# Patient Record
Sex: Female | Born: 1950
Health system: Southern US, Community
[De-identification: ages and names within clinical notes are randomized; demographics above are authoritative.]

## PROBLEM LIST (undated history)

## (undated) DIAGNOSIS — C4492 Squamous cell carcinoma of skin, unspecified: Secondary | ICD-10-CM

## (undated) DIAGNOSIS — D229 Melanocytic nevi, unspecified: Secondary | ICD-10-CM

## (undated) DIAGNOSIS — I10 Essential (primary) hypertension: Secondary | ICD-10-CM

## (undated) HISTORY — DX: Squamous cell carcinoma of skin, unspecified: C44.92

## (undated) HISTORY — PX: ABDOMINAL HYSTERECTOMY: SHX81

## (undated) HISTORY — DX: Melanocytic nevi, unspecified: D22.9

## (undated) HISTORY — PX: BREAST BIOPSY: SHX20

---

## 2006-11-10 DIAGNOSIS — C4492 Squamous cell carcinoma of skin, unspecified: Secondary | ICD-10-CM

## 2006-11-10 HISTORY — DX: Squamous cell carcinoma of skin, unspecified: C44.92

## 2010-06-17 ENCOUNTER — Ambulatory Visit (HOSPITAL_COMMUNITY)
Admission: RE | Admit: 2010-06-17 | Discharge: 2010-06-17 | Payer: Self-pay | Source: Home / Self Care | Attending: Internal Medicine | Admitting: Internal Medicine

## 2013-06-20 ENCOUNTER — Other Ambulatory Visit (HOSPITAL_COMMUNITY): Payer: Self-pay | Admitting: Family Medicine

## 2013-06-20 DIAGNOSIS — Z139 Encounter for screening, unspecified: Secondary | ICD-10-CM

## 2013-06-21 ENCOUNTER — Ambulatory Visit (HOSPITAL_COMMUNITY)
Admission: RE | Admit: 2013-06-21 | Discharge: 2013-06-21 | Disposition: A | Discharge status: Home / Self Care | Payer: BC Managed Care – PPO | Source: Ambulatory Visit | Attending: Family Medicine | Admitting: Family Medicine

## 2013-06-21 DIAGNOSIS — Z139 Encounter for screening, unspecified: Secondary | ICD-10-CM

## 2013-06-21 DIAGNOSIS — Z1231 Encounter for screening mammogram for malignant neoplasm of breast: Secondary | ICD-10-CM | POA: Insufficient documentation

## 2013-10-02 ENCOUNTER — Telehealth: Payer: Self-pay | Admitting: *Deleted

## 2013-10-02 NOTE — Telephone Encounter (Signed)
Pt called wanting to schedule her colonoscopy. Please advise [phone]. Pt said her summer is filled up later this year is better for her.

## 2013-10-03 NOTE — Telephone Encounter (Signed)
Tried to call pt and line was busy.  

## 2013-10-25 NOTE — Telephone Encounter (Signed)
On my reminder list.

## 2014-01-22 DIAGNOSIS — D229 Melanocytic nevi, unspecified: Secondary | ICD-10-CM

## 2014-01-22 HISTORY — DX: Melanocytic nevi, unspecified: D22.9

## 2014-04-04 ENCOUNTER — Telehealth: Payer: Self-pay

## 2014-04-04 NOTE — Telephone Encounter (Signed)
Pt called to be set up for her tcs. She had received a letter that she's due for her 10 year tcs. [phone]

## 2014-04-29 ENCOUNTER — Other Ambulatory Visit: Payer: Self-pay

## 2014-04-29 ENCOUNTER — Telehealth: Payer: Self-pay

## 2014-04-29 DIAGNOSIS — Z1211 Encounter for screening for malignant neoplasm of colon: Secondary | ICD-10-CM

## 2014-04-29 NOTE — Telephone Encounter (Signed)
OK to schedule

## 2014-04-29 NOTE — Telephone Encounter (Signed)
Gastroenterology Pre-Procedure Review  Request Date: 04/29/2014 Requesting Physician: Dr. Orson Ape  PATIENT REVIEW QUESTIONS: The patient responded to the following health history questions as indicated:    Pt said she had a colonocopy about 11 years ago in [place] and was told to have her next in 10 years  1. Diabetes Melitis: no 2. Joint replacements in the past 12 months: no 3. Major health problems in the past 3 months: no 4. Has an artificial valve or MVP: no 5. Has a defibrillator: no 6. Has been advised in past to take antibiotics in advance of a procedure like teeth cleaning: no    MEDICATIONS & ALLERGIES:    Patient reports the following regarding taking any blood thinners:   Plavix? no Aspirin? no Coumadin? no  Patient confirms/reports the following medications:  Current Outpatient Prescriptions  Medication Sig Dispense Refill  . atenolol (TENORMIN) 25 MG tablet Take 25 mg by mouth 2 (two) times daily.    . simvastatin (ZOCOR) 10 MG tablet Take 10 mg by mouth daily.     No current facility-administered medications for this visit.    Patient confirms/reports the following allergies:  No Known Allergies  No orders of the defined types were placed in this encounter.    AUTHORIZATION INFORMATION Primary Insurance:  ID #:   Group #:  Pre-Cert / Auth required:  Pre-Cert / Auth #:   Secondary Insurance:   ID #:   Group #:  Pre-Cert / Auth required: Pre-Cert / Auth #:   SCHEDULE INFORMATION: Procedure has been scheduled as follows:  Date: 05/20/2014               Time: 9:30 Am Location: [facility] Short Stay  This Gastroenterology Pre-Precedure Review Form is being routed to the following provider(s): R. Garfield Cornea, MD

## 2014-04-29 NOTE — Telephone Encounter (Signed)
See separate triage.  

## 2014-04-30 MED ORDER — PEG-KCL-NACL-NASULF-NA ASC-C 100 G PO SOLR: 1.0000 | ORAL | Status: DC

## 2014-04-30 NOTE — Telephone Encounter (Signed)
Rx sent to the pharmacy and instructions mailed to pt.  

## 2014-05-08 ENCOUNTER — Telehealth: Payer: Self-pay

## 2014-05-09 NOTE — Telephone Encounter (Signed)
Pt had left message she has a question about her insurance. LMOM for a return call.

## 2014-05-10 ENCOUNTER — Telehealth: Payer: Self-pay

## 2014-05-10 NOTE — Telephone Encounter (Signed)
Patient was returning DS call. Patient is out of town and gave me this number for DS to call. [phone]

## 2014-05-10 NOTE — Telephone Encounter (Signed)
ERROR- the number with area code [phone] is incorrect number. Do not call that number.

## 2014-05-10 NOTE — Telephone Encounter (Signed)
LMOM that I had a message that she had a question about her insurance and that we close today at noon.

## 2014-05-20 ENCOUNTER — Ambulatory Visit (HOSPITAL_COMMUNITY)
Admission: RE | Admit: 2014-05-20 | Discharge: 2014-05-20 | Disposition: A | Discharge status: Home / Self Care | Payer: BC Managed Care – PPO | Source: Ambulatory Visit | Attending: Internal Medicine | Admitting: Internal Medicine

## 2014-05-20 ENCOUNTER — Encounter (HOSPITAL_COMMUNITY): Payer: Self-pay

## 2014-05-20 ENCOUNTER — Encounter (HOSPITAL_COMMUNITY)
Admission: RE | Disposition: A | Discharge status: Home / Self Care | Payer: Self-pay | Source: Ambulatory Visit | Attending: Internal Medicine

## 2014-05-20 DIAGNOSIS — K648 Other hemorrhoids: Secondary | ICD-10-CM | POA: Diagnosis not present

## 2014-05-20 DIAGNOSIS — Z1211 Encounter for screening for malignant neoplasm of colon: Secondary | ICD-10-CM

## 2014-05-20 DIAGNOSIS — K5730 Diverticulosis of large intestine without perforation or abscess without bleeding: Secondary | ICD-10-CM | POA: Insufficient documentation

## 2014-05-20 DIAGNOSIS — I10 Essential (primary) hypertension: Secondary | ICD-10-CM | POA: Diagnosis not present

## 2014-05-20 HISTORY — PX: COLONOSCOPY: SHX5424

## 2014-05-20 HISTORY — DX: Essential (primary) hypertension: I10

## 2014-05-20 SURGERY — COLONOSCOPY
Anesthesia: Moderate Sedation

## 2014-05-20 MED ORDER — ONDANSETRON HCL 4 MG/2ML IJ SOLN
INTRAMUSCULAR | Status: DC | PRN
  Administered 2014-05-20: 4 mg via INTRAVENOUS

## 2014-05-20 MED ORDER — SODIUM CHLORIDE 0.9 % IV SOLN
INTRAVENOUS | Status: DC
  Administered 2014-05-20: 09:00:00 via INTRAVENOUS

## 2014-05-20 MED ORDER — MEPERIDINE HCL 100 MG/ML IJ SOLN
INTRAMUSCULAR | Status: DC | PRN
  Administered 2014-05-20: 25 mg via INTRAVENOUS
  Administered 2014-05-20: 50 mg via INTRAVENOUS
  Administered 2014-05-20: 25 mg via INTRAVENOUS

## 2014-05-20 MED ORDER — MIDAZOLAM HCL 5 MG/5ML IJ SOLN
INTRAMUSCULAR | Status: DC | PRN
  Administered 2014-05-20: 1 mg via INTRAVENOUS
  Administered 2014-05-20 (×2): 2 mg via INTRAVENOUS
  Administered 2014-05-20: 1 mg via INTRAVENOUS

## 2014-05-20 MED ORDER — STERILE WATER FOR IRRIGATION IR SOLN
Status: DC | PRN
  Administered 2014-05-20: 09:00:00

## 2014-05-20 MED ORDER — ONDANSETRON HCL 4 MG/2ML IJ SOLN
INTRAMUSCULAR | Status: AC
  Filled 2014-05-20: qty 2

## 2014-05-20 MED ORDER — MEPERIDINE HCL 100 MG/ML IJ SOLN
INTRAMUSCULAR | Status: AC
  Filled 2014-05-20: qty 2

## 2014-05-20 MED ORDER — MIDAZOLAM HCL 5 MG/5ML IJ SOLN
INTRAMUSCULAR | Status: AC
  Filled 2014-05-20: qty 10

## 2014-05-20 NOTE — Discharge Instructions (Addendum)
Colonoscopy Discharge Instructions  Read the instructions outlined below and refer to this sheet in the next few weeks. These discharge instructions provide you with general information on caring for yourself after you leave the hospital. Your doctor may also give you specific instructions. While your treatment has been planned according to the most current medical practices available, unavoidable complications occasionally occur. If you have any problems or questions after discharge, call Dr. Gala Romney at [phone]. ACTIVITY  You may resume your regular activity, but move at a slower pace for the next 24 hours.   Take frequent rest periods for the next 24 hours.   Walking will help get rid of the air and reduce the bloated feeling in your belly (abdomen).   No driving for 24 hours (because of the medicine (anesthesia) used during the test).    Do not sign any important legal documents or operate any machinery for 24 hours (because of the anesthesia used during the test).  NUTRITION  Drink plenty of fluids.   You may resume your normal diet as instructed by your doctor.   Begin with a light meal and progress to your normal diet. Heavy or fried foods are harder to digest and may make you feel sick to your stomach (nauseated).   Avoid alcoholic beverages for 24 hours or as instructed.  MEDICATIONS  You may resume your normal medications unless your doctor tells you otherwise.  WHAT YOU CAN EXPECT TODAY  Some feelings of bloating in the abdomen.   Passage of more gas than usual.   Spotting of blood in your stool or on the toilet paper.  IF YOU HAD POLYPS REMOVED DURING THE COLONOSCOPY:  No aspirin products for 7 days or as instructed.   No alcohol for 7 days or as instructed.   Eat a soft diet for the next 24 hours.  FINDING OUT THE RESULTS OF YOUR TEST Not all test results are available during your visit. If your test results are not back during the visit, make an appointment  with your caregiver to find out the results. Do not assume everything is normal if you have not heard from your caregiver or the medical facility. It is important for you to follow up on all of your test results.  SEEK IMMEDIATE MEDICAL ATTENTION IF:  You have more than a spotting of blood in your stool.   Your belly is swollen (abdominal distention).   You are nauseated or vomiting.   You have a temperature over 101.   You have abdominal pain or discomfort that is severe or gets worse throughout the day.    Diverticulosis information provided  Repeat screening colonoscopy in 10 years  Diverticulosis Diverticulosis is the condition that develops when small pouches (diverticula) form in the wall of your colon. Your colon, or large intestine, is where water is absorbed and stool is formed. The pouches form when the inside layer of your colon pushes through weak spots in the outer layers of your colon. CAUSES  No one knows exactly what causes diverticulosis. RISK FACTORS  Being older than 51. Your risk for this condition increases with age. Diverticulosis is rare in people younger than 40 years. By age 35, almost everyone has it.  Eating a low-fiber diet.  Being frequently constipated.  Being overweight.  Not getting enough exercise.  Smoking.  Taking over-the-counter pain medicines, like aspirin and ibuprofen. SYMPTOMS  Most people with diverticulosis do not have symptoms. DIAGNOSIS  Because diverticulosis often has no symptoms,  health care providers often discover the condition during an exam for other colon problems. In many cases, a health care provider will diagnose diverticulosis while using a flexible scope to examine the colon (colonoscopy). TREATMENT  If you have never developed an infection related to diverticulosis, you may not need treatment. If you have had an infection before, treatment may include:  Eating more fruits, vegetables, and grains.  Taking a fiber  supplement.  Taking a live bacteria supplement (probiotic).  Taking medicine to relax your colon. HOME CARE INSTRUCTIONS   Drink at least 6-8 glasses of water each day to prevent constipation.  Try not to strain when you have a bowel movement.  Keep all follow-up appointments. If you have had an infection before:  Increase the fiber in your diet as directed by your health care provider or dietitian.  Take a dietary fiber supplement if your health care provider approves.  Only take medicines as directed by your health care provider. SEEK MEDICAL CARE IF:   You have abdominal pain.  You have bloating.  You have cramps.  You have not gone to the bathroom in 3 days. SEEK IMMEDIATE MEDICAL CARE IF:   Your pain gets worse.  Yourbloating becomes very bad.  You have a fever or chills, and your symptoms suddenly get worse.  You begin vomiting.  You have bowel movements that are bloody or black. MAKE SURE YOU:  Understand these instructions.  Will watch your condition.  Will get help right away if you are not doing well or get worse. Document Released: 02/19/2004 Document Revised: 05/29/2013 Document Reviewed: 04/18/2013 [facility] Patient Information 2015 [place], [place]. This information is not intended to replace advice given to you by your health care provider. Make sure you discuss any questions you have with your health care provider.

## 2014-05-20 NOTE — Op Note (Signed)
[facility] [address] [place], [address]   COLONOSCOPY PROCEDURE REPORT  PATIENT: Jacqueline, Weiss  MR#: [id number] BIRTHDATE: Apr 17, 1951 , 52  yrs. old GENDER: female ENDOSCOPIST: R.  Garfield Cornea, MD FACP [facility] REFERRED PR:FFMBWGY Orson Ape, M.D. PROCEDURE DATE:  26-May-2014 PROCEDURE:   Colonoscopy, screening INDICATIONS:Average risk colorectal cancer screening examination. MEDICATIONS: Versed 6 mg IV and Demerol 100 mg IV in divided doses. Zofran 4 mg IV. ASA CLASS:       Class II  CONSENT: The risks, benefits, alternatives and imponderables including but not limited to bleeding, perforation as well as the possibility of a missed lesion have been reviewed.  The potential for biopsy, lesion removal, etc. have also been discussed. Questions have been answered.  All parties agreeable.  Please see the history and physical in the medical record for more information.  DESCRIPTION OF PROCEDURE:   After the risks benefits and alternatives of the procedure were thoroughly explained, informed consent was obtained.  The digital rectal exam revealed no abnormalities of the rectum.   The EC-3890Li ([id number])  endoscope was introduced through the anus and advanced to the Minimal internal hemorrhoids; otherwise normal rectum.  Scattered left-sided diverticula; otherwise, normal-appearing colonic mucosa No adverse events experienced.   The quality of the prep was adequate.  The instrument was then slowly withdrawn as the colon was fully examined.  A retroflexion in the rectum was performed.     Withdrawal time=13 minutes 0 seconds.  The scope was withdrawn and the procedure completed. COMPLICATIONS: There were no immediate complications.  ENDOSCOPIC IMPRESSION: Minimal internal hemorrhoids. Left-sided diverticulosis  RECOMMENDATIONS: Repeat screening colonoscopy in 10 years  eSigned:  R. Garfield Cornea, MD Rosalita Chessman [facility] 26-May-2014 10:14 AM   cc:  CPT CODES: ICD  CODES:  The ICD and CPT codes recommended by this software are interpretations from the data that the clinical staff has captured with the software.  The verification of the translation of this report to the ICD and CPT codes and modifiers is the sole responsibility of the health care institution and practicing physician where this report was generated.  [employer]. will not be held responsible for the validity of the ICD and CPT codes included on this report.  AMA assumes no liability for data contained or not contained herein. CPT is a Designer, television/film set of the [employer].  PATIENT NAME:  Jacqueline, Weiss MR#: [id number]

## 2014-05-20 NOTE — H&P (Signed)
@  [facility]@   Primary Care Physician:  Leonides Grills, MD Primary Gastroenterologist:  Dr. Gala Romney  Pre-Procedure History & Physical: HPI:  Jacqueline Weiss is a 63 y.o. female is here for a screening colonoscopy. Last colonoscopy done by Dr.Pandya in [place] 11 years ago. Small benign left colon polyp resected per patient and diverticulosis. It was recommended she come back in 10 years. Currently, no GI symptoms. No family history of colon cancer.    Past Medical History  Diagnosis Date  . Hypertension     Past Surgical History  Procedure Laterality Date  . Abdominal hysterectomy      Prior to Admission medications   Medication Sig Start Date End Date Taking? Authorizing Provider  atenolol (TENORMIN) 25 MG tablet Take 25 mg by mouth 2 (two) times daily.   Yes Historical Provider, MD  peg 3350 powder (MOVIPREP) 100 G SOLR Take 1 kit (200 g total) by mouth as directed. 04/30/14  Yes Daneil Dolin, MD  simvastatin (ZOCOR) 10 MG tablet Take 10 mg by mouth daily.   Yes Historical Provider, MD    Allergies as of 04/29/2014  . (No Known Allergies)    History reviewed. No pertinent family history.  History   Social History  . Marital Status: Married    Spouse Name: N/A    Number of Children: N/A  . Years of Education: N/A   Occupational History  . Not on file.   Social History Main Topics  . Smoking status: Never Smoker   . Smokeless tobacco: Not on file  . Alcohol Use: 1.8 oz/week    3 Glasses of wine per week  . Drug Use: No  . Sexual Activity: Yes   Other Topics Concern  . Not on file   Social History Narrative  . No narrative on file    Review of Systems: See HPI, otherwise negative ROS  Physical Exam: BP 152/87 mmHg  Pulse 75  Temp(Src) 97.7 F (36.5 C) (Oral)  Resp 22  Ht _0  (1.549 m)  Wt 130 lb (58.968 kg)  BMI 24.58 kg/m2  SpO2 100% General:   Alert,  Well-developed, well-nourished, pleasant and cooperative in NAD Head:  Normocephalic and  atraumatic. Eyes:  Sclera clear, no icterus.   Conjunctiva pink. Ears:  Normal auditory acuity. Nose:  No deformity, discharge,  or lesions. Mouth:  No deformity or lesions, dentition normal. Neck:  Supple; no masses or thyromegaly. Lungs:  Clear throughout to auscultation.   No wheezes, crackles, or rhonchi. No acute distress. Heart:  Regular rate and rhythm; no murmurs, clicks, rubs,  or gallops. Abdomen:  Soft, nontender and nondistended. No masses, hepatosplenomegaly or hernias noted. Normal bowel sounds, without guarding, and without rebound.   Msk:  Symmetrical without gross deformities. Normal posture. Pulses:  Normal pulses noted. Extremities:  Without clubbing or edema. Neurologic:  Alert and  oriented x4;  grossly normal neurologically. Skin:  Intact without significant lesions or rashes. Cervical Nodes:  No significant cervical adenopathy. Psych:  Alert and cooperative. Normal mood and affect.  Impression/Plan: Jacqueline Weiss is now here to undergo a screening colonoscopy.  Average risk screening examination. Risks, benefits, limitations, imponderables and alternatives regarding colonoscopy have been reviewed with the patient. Questions have been answered. All parties agreeable.     Notice:  This dictation was prepared with Dragon dictation along with smaller phrase technology. Any transcriptional errors that result from this process are unintentional and may not be corrected upon review.

## 2014-05-22 ENCOUNTER — Encounter (HOSPITAL_COMMUNITY): Payer: Self-pay | Admitting: Internal Medicine

## 2014-05-28 NOTE — Telephone Encounter (Signed)
PT had procedure on 05/20/2014.

## 2014-05-29 NOTE — Telephone Encounter (Signed)
Pt had TCS 05/20/2014.

## 2015-02-13 ENCOUNTER — Ambulatory Visit: Payer: Self-pay | Admitting: Orthopedic Surgery

## 2015-02-18 ENCOUNTER — Ambulatory Visit (INDEPENDENT_AMBULATORY_CARE_PROVIDER_SITE_OTHER): Payer: BLUE CROSS/BLUE SHIELD

## 2015-02-18 ENCOUNTER — Ambulatory Visit (INDEPENDENT_AMBULATORY_CARE_PROVIDER_SITE_OTHER): Payer: BLUE CROSS/BLUE SHIELD | Admitting: Orthopedic Surgery

## 2015-02-18 ENCOUNTER — Encounter: Payer: Self-pay | Admitting: Orthopedic Surgery

## 2015-02-18 VITALS — BP 158/85 | Ht 61.0 in | Wt 133.0 lb

## 2015-02-18 DIAGNOSIS — M25562 Pain in left knee: Secondary | ICD-10-CM

## 2015-02-18 NOTE — Patient Instructions (Signed)
Joint Injection  Care After  Refer to this sheet in the next few days. These instructions provide you with information on caring for yourself after you have had a joint injection. Your caregiver also may give you more specific instructions. Your treatment has been planned according to current medical practices, but problems sometimes occur. Call your caregiver if you have any problems or questions after your procedure.  After any type of joint injection, it is not uncommon to experience:  · Soreness, swelling, or bruising around the injection site.  · Mild numbness, tingling, or weakness around the injection site caused by the numbing medicine used before or with the injection.  It also is possible to experience the following effects associated with the specific agent after injection:  · Iodine-based contrast agents:  ¨ Allergic reaction (itching, hives, widespread redness, and swelling beyond the injection site).  · Corticosteroids (These effects are rare.):  ¨ Allergic reaction.  ¨ Increased blood sugar levels (If you have diabetes and you notice that your blood sugar levels have increased, notify your caregiver).  ¨ Increased blood pressure levels.  ¨ Mood swings.  · Hyaluronic acid in the use of viscosupplementation.  ¨ Temporary heat or redness.  ¨ Temporary rash and itching.  ¨ Increased fluid accumulation in the injected joint.  These effects all should resolve within a day after your procedure.   HOME CARE INSTRUCTIONS  · Limit yourself to light activity the day of your procedure. Avoid lifting heavy objects, bending, stooping, or twisting.  · Take prescription or over-the-counter pain medication as directed by your caregiver.  · You may apply ice to your injection site to reduce pain and swelling the day of your procedure. Ice may be applied 03-04 times:  ¨ Put ice in a plastic bag.  ¨ Place a towel between your skin and the bag.  ¨ Leave the ice on for no longer than 15-20 minutes each time.  SEEK  IMMEDIATE MEDICAL CARE IF:   · Pain and swelling get worse rather than better or extend beyond the injection site.  · Numbness does not go away.  · Blood or fluid continues to leak from the injection site.  · You have chest pain.  · You have swelling of your face or tongue.  · You have trouble breathing or you become dizzy.  · You develop a fever, chills, or severe tenderness at the injection site that last longer than 1 day.  MAKE SURE YOU:  · Understand these instructions.  · Watch your condition.  · Get help right away if you are not doing well or if you get worse.  Document Released: 02/04/2011 Document Revised: 08/16/2011 Document Reviewed: 02/04/2011  ExitCare® Patient Information ©2015 [employer]. This information is not intended to replace advice given to you by your health care provider. Make sure you discuss any questions you have with your health care provider.

## 2015-02-18 NOTE — Progress Notes (Signed)
Patient ID: Jacqueline Weiss, female   DOB: 1950-10-09, 64 y.o.   MRN: [id number] New patient evaluation   Chief Complaint  Patient presents with  . Knee Pain    left knee pain and swelling x 4 months     Jacqueline Weiss is a 64 y.o. female.   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 No significant trauma other than this slight slip. She complains of pain going up the steps and with kneeling. She had intermittent time where she had some ankle pain and leg swelling that went away but the pain seemed to stay in the knee. It's anterior. It is associated with swelling and stiffness. It's worse after activity. Pain level is 3 out of 10.  Review of systems is normal  She does have a family history of hypertension Review of Systems See hpi  Past Medical History  Diagnosis Date  . Hypertension     Past Surgical History  Procedure Laterality Date  . Abdominal hysterectomy    . Colonoscopy N/A 05/20/2014    Procedure: COLONOSCOPY;  Surgeon: Daneil Dolin, MD;  Location: [facility];  Service: Endoscopy;  Laterality: N/A;  930    No family history on file.  Social History Social History  Substance Use Topics  . Smoking status: Never Smoker   . Smokeless tobacco: None  . Alcohol Use: 1.8 oz/week    3 Glasses of wine per week    No Known Allergies  Current Outpatient Prescriptions  Medication Sig Dispense Refill  . atenolol (TENORMIN) 25 MG tablet Take 25 mg by mouth 2 (two) times daily.    . hydrochlorothiazide (MICROZIDE) 12.5 MG capsule Take 12.5 mg by mouth daily.    . simvastatin (ZOCOR) 10 MG tablet Take 10 mg by mouth daily.     No current facility-administered medications for this visit.       Physical Exam Blood pressure 158/85, height 5\' 1"  (1.549 m), weight 133 lb (60.328 kg). Physical Exam The patient is well developed well nourished and well groomed. Orientation to person place and  time is normal  Mood is pleasant. Ambulatory status is normal without a limp Right knee range of motion stability and strength are normal neurovascular exam is intact and there is no swelling in the joint patellofemoral joint is nonreactive. Skin no surgical scars  Left knee there is swelling in the synovium around the patella there is crepitance there is a positive quadriceps contraction in addition test. This patellar facet pain with palpation. There is no frank joint effusion. She has decreased flexion on the left compared to the right negative McMurray sign no medial or lateral joint line tenderness ligaments are stable neurovascular exam remains intact motor exam is normal  Skin shows no surgical lesions  Data Reviewed Hospital x-rays. I interpreted those is normal left knee Assessment  Patellofemoral pain recommend injection and take Aleve twice a day or once a day for 2 weeks if no improvement please come back for review   Procedure note left knee injection verbal consent was obtained to inject left knee joint  Timeout was completed to confirm the site of injection  The medications used were 40 mg of Depo-Medrol and 1% lidocaine 3 cc  Anesthesia was provided by ethyl chloride and the skin was prepped with alcohol.  After cleaning the skin with alcohol a 20-gauge needle was used to inject the left knee joint.  There were no complications. A sterile bandage was applied.

## 2016-12-23 ENCOUNTER — Other Ambulatory Visit (HOSPITAL_COMMUNITY): Payer: Self-pay | Admitting: Internal Medicine

## 2016-12-23 DIAGNOSIS — Z1231 Encounter for screening mammogram for malignant neoplasm of breast: Secondary | ICD-10-CM

## 2017-04-13 ENCOUNTER — Ambulatory Visit (HOSPITAL_COMMUNITY): Payer: BLUE CROSS/BLUE SHIELD

## 2017-06-09 DIAGNOSIS — J019 Acute sinusitis, unspecified: Secondary | ICD-10-CM | POA: Diagnosis not present

## 2017-06-09 DIAGNOSIS — Z1389 Encounter for screening for other disorder: Secondary | ICD-10-CM | POA: Diagnosis not present

## 2017-06-09 DIAGNOSIS — H6993 Unspecified Eustachian tube disorder, bilateral: Secondary | ICD-10-CM | POA: Diagnosis not present

## 2017-06-09 DIAGNOSIS — Z6825 Body mass index (BMI) 25.0-25.9, adult: Secondary | ICD-10-CM | POA: Diagnosis not present

## 2017-07-11 ENCOUNTER — Encounter (HOSPITAL_COMMUNITY): Payer: Self-pay

## 2017-07-11 ENCOUNTER — Ambulatory Visit (HOSPITAL_COMMUNITY)
Admission: RE | Admit: 2017-07-11 | Discharge: 2017-07-11 | Disposition: A | Discharge status: Home / Self Care | Payer: Medicare Other | Source: Ambulatory Visit | Attending: Internal Medicine | Admitting: Internal Medicine

## 2017-07-11 DIAGNOSIS — Z1231 Encounter for screening mammogram for malignant neoplasm of breast: Secondary | ICD-10-CM | POA: Diagnosis not present

## 2017-07-11 DIAGNOSIS — R928 Other abnormal and inconclusive findings on diagnostic imaging of breast: Secondary | ICD-10-CM | POA: Diagnosis not present

## 2017-07-12 DIAGNOSIS — L820 Inflamed seborrheic keratosis: Secondary | ICD-10-CM | POA: Diagnosis not present

## 2017-07-12 DIAGNOSIS — L304 Erythema intertrigo: Secondary | ICD-10-CM | POA: Diagnosis not present

## 2017-07-12 DIAGNOSIS — D229 Melanocytic nevi, unspecified: Secondary | ICD-10-CM | POA: Diagnosis not present

## 2017-07-19 ENCOUNTER — Other Ambulatory Visit (HOSPITAL_COMMUNITY): Payer: Self-pay | Admitting: Internal Medicine

## 2017-07-19 DIAGNOSIS — R928 Other abnormal and inconclusive findings on diagnostic imaging of breast: Secondary | ICD-10-CM

## 2017-07-26 ENCOUNTER — Ambulatory Visit (HOSPITAL_COMMUNITY)
Admission: RE | Admit: 2017-07-26 | Discharge: 2017-07-26 | Disposition: A | Discharge status: Home / Self Care | Payer: Medicare Other | Source: Ambulatory Visit | Attending: Internal Medicine | Admitting: Internal Medicine

## 2017-07-26 DIAGNOSIS — R928 Other abnormal and inconclusive findings on diagnostic imaging of breast: Secondary | ICD-10-CM

## 2017-07-26 DIAGNOSIS — N6002 Solitary cyst of left breast: Secondary | ICD-10-CM | POA: Insufficient documentation

## 2017-07-26 DIAGNOSIS — R922 Inconclusive mammogram: Secondary | ICD-10-CM | POA: Diagnosis not present

## 2017-07-26 DIAGNOSIS — N6012 Diffuse cystic mastopathy of left breast: Secondary | ICD-10-CM | POA: Diagnosis not present

## 2017-08-03 DIAGNOSIS — E782 Mixed hyperlipidemia: Secondary | ICD-10-CM | POA: Diagnosis not present

## 2017-08-03 DIAGNOSIS — R7309 Other abnormal glucose: Secondary | ICD-10-CM | POA: Diagnosis not present

## 2017-08-03 DIAGNOSIS — R739 Hyperglycemia, unspecified: Secondary | ICD-10-CM | POA: Diagnosis not present

## 2017-08-03 DIAGNOSIS — Z1389 Encounter for screening for other disorder: Secondary | ICD-10-CM | POA: Diagnosis not present

## 2017-08-03 DIAGNOSIS — Z0000 Encounter for general adult medical examination without abnormal findings: Secondary | ICD-10-CM | POA: Diagnosis not present

## 2017-08-03 DIAGNOSIS — E663 Overweight: Secondary | ICD-10-CM | POA: Diagnosis not present

## 2017-08-03 DIAGNOSIS — Z6825 Body mass index (BMI) 25.0-25.9, adult: Secondary | ICD-10-CM | POA: Diagnosis not present

## 2017-08-03 DIAGNOSIS — N62 Hypertrophy of breast: Secondary | ICD-10-CM | POA: Diagnosis not present

## 2017-08-03 DIAGNOSIS — I10 Essential (primary) hypertension: Secondary | ICD-10-CM | POA: Diagnosis not present

## 2018-02-03 DIAGNOSIS — Z1389 Encounter for screening for other disorder: Secondary | ICD-10-CM | POA: Diagnosis not present

## 2018-02-03 DIAGNOSIS — R7309 Other abnormal glucose: Secondary | ICD-10-CM | POA: Diagnosis not present

## 2018-02-03 DIAGNOSIS — E7849 Other hyperlipidemia: Secondary | ICD-10-CM | POA: Diagnosis not present

## 2018-02-03 DIAGNOSIS — Z6824 Body mass index (BMI) 24.0-24.9, adult: Secondary | ICD-10-CM | POA: Diagnosis not present

## 2018-03-15 DIAGNOSIS — N898 Other specified noninflammatory disorders of vagina: Secondary | ICD-10-CM | POA: Diagnosis not present

## 2018-03-15 DIAGNOSIS — Z9289 Personal history of other medical treatment: Secondary | ICD-10-CM | POA: Diagnosis not present

## 2018-03-15 DIAGNOSIS — Z1212 Encounter for screening for malignant neoplasm of rectum: Secondary | ICD-10-CM | POA: Diagnosis not present

## 2018-03-15 DIAGNOSIS — Z1289 Encounter for screening for malignant neoplasm of other sites: Secondary | ICD-10-CM | POA: Diagnosis not present

## 2018-05-16 DIAGNOSIS — H25013 Cortical age-related cataract, bilateral: Secondary | ICD-10-CM | POA: Diagnosis not present

## 2018-05-16 DIAGNOSIS — H5203 Hypermetropia, bilateral: Secondary | ICD-10-CM | POA: Diagnosis not present

## 2018-05-16 DIAGNOSIS — H524 Presbyopia: Secondary | ICD-10-CM | POA: Diagnosis not present

## 2018-05-16 DIAGNOSIS — H2513 Age-related nuclear cataract, bilateral: Secondary | ICD-10-CM | POA: Diagnosis not present

## 2018-05-16 DIAGNOSIS — H52203 Unspecified astigmatism, bilateral: Secondary | ICD-10-CM | POA: Diagnosis not present

## 2018-08-08 DIAGNOSIS — D229 Melanocytic nevi, unspecified: Secondary | ICD-10-CM | POA: Diagnosis not present

## 2018-08-08 DIAGNOSIS — L820 Inflamed seborrheic keratosis: Secondary | ICD-10-CM | POA: Diagnosis not present

## 2018-08-17 DIAGNOSIS — E663 Overweight: Secondary | ICD-10-CM | POA: Diagnosis not present

## 2018-08-17 DIAGNOSIS — Z0001 Encounter for general adult medical examination with abnormal findings: Secondary | ICD-10-CM | POA: Diagnosis not present

## 2018-08-17 DIAGNOSIS — Z1389 Encounter for screening for other disorder: Secondary | ICD-10-CM | POA: Diagnosis not present

## 2018-08-17 DIAGNOSIS — Z6825 Body mass index (BMI) 25.0-25.9, adult: Secondary | ICD-10-CM | POA: Diagnosis not present

## 2018-08-31 ENCOUNTER — Other Ambulatory Visit: Payer: Self-pay | Admitting: Family Medicine

## 2018-09-01 ENCOUNTER — Other Ambulatory Visit: Payer: Self-pay | Admitting: Family Medicine

## 2018-09-01 DIAGNOSIS — Z1231 Encounter for screening mammogram for malignant neoplasm of breast: Secondary | ICD-10-CM

## 2018-09-01 DIAGNOSIS — R5381 Other malaise: Secondary | ICD-10-CM

## 2018-09-05 ENCOUNTER — Encounter: Payer: Self-pay | Admitting: *Deleted

## 2018-09-15 ENCOUNTER — Other Ambulatory Visit: Payer: Self-pay | Admitting: Family Medicine

## 2018-09-15 DIAGNOSIS — E2839 Other primary ovarian failure: Secondary | ICD-10-CM

## 2019-08-28 ENCOUNTER — Ambulatory Visit (INDEPENDENT_AMBULATORY_CARE_PROVIDER_SITE_OTHER): Payer: Medicare Other | Admitting: Physician Assistant

## 2019-08-28 ENCOUNTER — Other Ambulatory Visit: Payer: Self-pay

## 2019-08-28 ENCOUNTER — Encounter: Payer: Self-pay | Admitting: Physician Assistant

## 2019-08-28 DIAGNOSIS — Z1283 Encounter for screening for malignant neoplasm of skin: Secondary | ICD-10-CM | POA: Diagnosis not present

## 2019-08-28 DIAGNOSIS — S80812A Abrasion, left lower leg, initial encounter: Secondary | ICD-10-CM | POA: Diagnosis not present

## 2019-08-28 DIAGNOSIS — L820 Inflamed seborrheic keratosis: Secondary | ICD-10-CM | POA: Diagnosis not present

## 2019-08-28 DIAGNOSIS — T148XXA Other injury of unspecified body region, initial encounter: Secondary | ICD-10-CM

## 2019-08-28 NOTE — Progress Notes (Addendum)
   Follow-Up Visit   Subjective  Jacqueline Weiss is a 69 y.o. female who presents for the following: Annual Exam (Left lower leg x 68months-no itch no bleed, right mid back- scaly).   The following portions of the chart were reviewed this encounter and updated as appropriate: Allergies  Meds  Problems  Med Hx  Surg Hx  Fam Hx  Soc Hx     Objective  Well appearing patient in no apparent distress; mood and affect are within normal limits.  A full examination was performed including scalp, head, eyes, ears, nose, lips, neck, chest, axillae, abdomen, back, buttocks, bilateral upper extremities, bilateral lower extremities, hands, feet, fingers, toes, fingernails, and toenails. All findings within normal limits unless otherwise noted below.  Objective  Left Lower Leg - Anterior: Healed wound with PIH and small white scar  Objective  Head - to Toe: No atypical nevi  Objective  Right Lower Back: Stuck-on, waxy papules and plaques.   Assessment & Plan  Abrasion Left Lower Leg - Anterior  Screening exam for skin cancer Head - to Toe  Yearly skin exams  Seborrheic keratosis, inflamed Right Lower Back  Destruction of lesion - Right Lower Back Complexity: simple   Destruction method: cryotherapy   Informed consent: discussed and consent obtained   Timeout:  patient name, date of birth, surgical site, and procedure verified Lesion destroyed using liquid nitrogen: Yes   Cryotherapy cycles:  1 Outcome: patient tolerated procedure well with no complications   Post-procedure details: wound care instructions given   No atypical nevi noted at the time of the visit.

## 2019-08-30 DIAGNOSIS — Z0001 Encounter for general adult medical examination with abnormal findings: Secondary | ICD-10-CM | POA: Diagnosis not present

## 2019-08-30 DIAGNOSIS — I10 Essential (primary) hypertension: Secondary | ICD-10-CM | POA: Diagnosis not present

## 2019-08-30 DIAGNOSIS — Z6825 Body mass index (BMI) 25.0-25.9, adult: Secondary | ICD-10-CM | POA: Diagnosis not present

## 2019-08-30 DIAGNOSIS — R7309 Other abnormal glucose: Secondary | ICD-10-CM | POA: Diagnosis not present

## 2019-08-30 DIAGNOSIS — Z1389 Encounter for screening for other disorder: Secondary | ICD-10-CM | POA: Diagnosis not present

## 2019-08-30 DIAGNOSIS — E7849 Other hyperlipidemia: Secondary | ICD-10-CM | POA: Diagnosis not present

## 2019-10-23 ENCOUNTER — Encounter: Payer: Self-pay | Admitting: Physician Assistant

## 2019-12-25 ENCOUNTER — Encounter: Payer: Self-pay | Admitting: Nurse Practitioner

## 2019-12-25 ENCOUNTER — Other Ambulatory Visit: Payer: Self-pay

## 2019-12-25 ENCOUNTER — Ambulatory Visit (INDEPENDENT_AMBULATORY_CARE_PROVIDER_SITE_OTHER): Payer: Medicare Other | Admitting: Nurse Practitioner

## 2019-12-25 DIAGNOSIS — R195 Other fecal abnormalities: Secondary | ICD-10-CM

## 2019-12-25 NOTE — Progress Notes (Signed)
Referring Provider: Jake Samples, PA* Primary Care Physician:  Jake Samples, PA-C Primary GI:  Dr. Gala Romney  Chief Complaint  Patient presents with  . loose stools    ongoing but has gotten little worse; can relate to some foods; started probiotic-has helped some    HPI:   Jacqueline Weiss is a 69 y.o. female who presents for loose stools.  The patient has not been seen in our office before.  Last colonoscopy 05/20/2014 which found minimal internal hemorrhoids, left-sided diverticulosis.  Recommended repeat screening in 10 years (2025).  Today she states she's never had a "normal stool" for as long as she can remember in her adult life. She recently started having more loose stools. Her baseline is variable stools, sometimes watery, sometimes mixed. She was doing a lot of camping the last 3 years and her husband noticed she was having more loose stools (although they were eating lots of different foods). She will tend to have more loose stools if she whats greasy stools; diet dependent. She recently started using probiotics and she feels it helps. She showed me pictures of her stools and appears to be [place] 5-6. Ty[pically has 1 stool a day in the morning; empties completely. Denies abdominal pain, hematochezia, melena. Denies N/V, fever, chills, unintentional weight loss. Denies URI or flu-like symptoms. Denies loss of sense of taste or smell. The patient has not received COVID-19 vaccination(s). Declines information on scheduling a vaccine at this time. Denies chest pain, dyspnea, dizziness, lightheadedness, syncope, near syncope. Denies any other upper or lower GI symptoms.  If she does have greasy foods, Imodium helps.  Past Medical History:  Diagnosis Date  . Atypical nevus 01/22/2014   mild atypia - lower back (no treatment)  . Hypertension   . SCC (squamous cell carcinoma) in situ 11/10/2006   left lower leg - treated with Aldara cream    Past Surgical History:    Procedure Laterality Date  . ABDOMINAL HYSTERECTOMY    . BREAST BIOPSY Right    benign  . COLONOSCOPY N/A 05/20/2014   Procedure: COLONOSCOPY;  Surgeon: Daneil Dolin, MD;  Location: [facility];  Service: Endoscopy;  Laterality: N/A;  930    Current Outpatient Medications  Medication Sig Dispense Refill  . aspirin EC 81 MG tablet Take 81 mg by mouth daily.    Marland Kitchen atenolol (TENORMIN) 25 MG tablet Take 25 mg by mouth 2 (two) times daily.    . hydrochlorothiazide (MICROZIDE) 12.5 MG capsule Take 12.5 mg by mouth daily.    . Probiotic Product (PROBIOTIC DAILY PO) Take by mouth daily. 10 Strain Probiotic once a day    . simvastatin (ZOCOR) 10 MG tablet Take 10 mg by mouth daily.     No current facility-administered medications for this visit.    Allergies as of 12/25/2019  . (No Known Allergies)    Family History  Problem Relation Age of Onset  . Colon cancer Neg Hx     Social History   Socioeconomic History  . Marital status: Married    Spouse name: Not on file  . Number of children: Not on file  . Years of education: Not on file  . Highest education level: Not on file  Occupational History  . Not on file  Tobacco Use  . Smoking status: Never Smoker  . Smokeless tobacco: Never Used  Substance and Sexual Activity  . Alcohol use: Yes    Comment: couple glasses of wine daily  .  Drug use: No  . Sexual activity: Yes  Other Topics Concern  . Not on file  Social History Narrative  . Not on file   Social Determinants of Health   Financial Resource Strain:   . Difficulty of Paying Living Expenses:   Food Insecurity:   . Worried About Charity fundraiser in the Last Year:   . Arboriculturist in the Last Year:   Transportation Needs:   . Film/video editor (Medical):   Marland Kitchen Lack of Transportation (Non-Medical):   Physical Activity:   . Days of Exercise per Week:   . Minutes of Exercise per Session:   Stress:   . Feeling of Stress :   Social Connections:   .  Frequency of Communication with Friends and Family:   . Frequency of Social Gatherings with Friends and Family:   . Attends Religious Services:   . Active Member of Clubs or Organizations:   . Attends Archivist Meetings:   Marland Kitchen Marital Status:     Subjective: Review of Systems  Constitutional: Negative for chills, fever, malaise/fatigue and weight loss.  HENT: Negative for congestion and sore throat.   Respiratory: Negative for cough and shortness of breath.   Cardiovascular: Negative for chest pain and palpitations.  Gastrointestinal: Negative for abdominal pain, blood in stool, constipation, diarrhea, melena, nausea and vomiting.  Musculoskeletal: Negative for joint pain and myalgias.  Skin: Negative for rash.  Neurological: Negative for dizziness and weakness.  Endo/Heme/Allergies: Does not bruise/bleed easily.  Psychiatric/Behavioral: Negative for depression. The patient is not nervous/anxious.   All other systems reviewed and are negative.    Objective: BP (!) 158/73   Pulse 68   Temp (!) 97 F (36.1 C) (Oral)   Ht 5\' 1"  (1.549 m)   Wt 138 lb 3.2 oz (62.7 kg)   BMI 26.11 kg/m  Physical Exam Vitals and nursing note reviewed.  Constitutional:      General: She is not in acute distress.    Appearance: Normal appearance. She is well-developed and normal weight. She is not ill-appearing, toxic-appearing or diaphoretic.  HENT:     Head: Normocephalic and atraumatic.     Nose: No congestion or rhinorrhea.  Eyes:     General: No scleral icterus. Cardiovascular:     Rate and Rhythm: Normal rate and regular rhythm.     Heart sounds: Normal heart sounds.  Pulmonary:     Effort: Pulmonary effort is normal. No respiratory distress.     Breath sounds: Normal breath sounds.  Abdominal:     General: Bowel sounds are normal.     Palpations: Abdomen is soft. There is no hepatomegaly, splenomegaly or mass.     Tenderness: There is no abdominal tenderness. There is no  guarding or rebound.     Hernia: No hernia is present.  Skin:    General: Skin is warm and dry.     Coloration: Skin is not jaundiced.     Findings: No rash.  Neurological:     General: No focal deficit present.     Mental Status: She is alert and oriented to person, place, and time.  Psychiatric:        Attention and Perception: Attention normal.        Mood and Affect: Mood normal.        Speech: Speech normal.        Behavior: Behavior normal.        Thought Content: Thought content normal.  Cognition and Memory: Cognition and memory normal.       12/25/2019 9:16 AM   Disclaimer: This note was dictated with voice recognition software. Similar sounding words can inadvertently be transcribed and may not be corrected upon review.

## 2019-12-25 NOTE — Patient Instructions (Addendum)
Your health issues we discussed today were:   Soft/loose stool: 1. As we discussed, stools are variable amongst individuals 2. Your stools do seem a bit soft, but this can be your normal.  I am glad you have gotten relief from probiotics. 3. Continue taking the probiotics for at least 1 to 2 months to see if you have continued improvement 4. If you have a "dietary indiscretion" that results in loose stools you can use Imodium to help 5. Notify us if you see any drastic changes or any blood in your stools 6. Call us for any worsening or severe symptoms  Overall I recommend:  1. Continue your other current medications 2. Follow-up in 6 months 3. Call us if you have any questions or concerns   At [facility] we value your feedback. You may receive a survey about your visit today. Please share your experience as we strive to create trusting relationships with our patients to provide genuine, compassionate, quality care.  We appreciate your understanding and patience as we review any laboratory studies, imaging, and other diagnostic tests that are ordered as we care for you. Our office policy is 5 business days for review of these results, and any emergent or urgent results are addressed in a timely manner for your best interest. If you do not hear from our office in 1 week, please contact us.   We also encourage the use of MyChart, which contains your medical information for your review as well. If you are not enrolled in this feature, an access code is on this after visit summary for your convenience. Thank you for allowing [place] to be involved in your care.  It was great to see you today!  I hope you have a great Summer!!

## 2019-12-25 NOTE — Assessment & Plan Note (Signed)
Patient states her stools have been "somewhat abnormal" for as long as she can remember.  Typically has softer/looser stools than what most people have.  She has noticed this progressed a little bit recently with occasional loose/watery stools, particularly with dietary indiscretions such as greasy foods.  Her husband wanted her to get evaluated.  She showed me pictures of her stools and it appeared to be [place] to [place] 6.  She denies any other associated symptoms such as abdominal pain, nausea/vomiting, blood in her stools.  She is up-to-date on colonoscopy.  Her OB/GYN checks her stool for blood every year and has not found any.  At this point I feel she likely does have softer stools than her husband.  Recommend she continue probiotics as she has gotten some improvement with this.  She can use Imodium for loose stools related to dietary indiscretions.  Call for any worsening symptoms or concerns.  Follow-up in 6 months and then likely as needed after that, if no worsening.

## 2020-06-26 ENCOUNTER — Ambulatory Visit: Payer: Medicare Other | Admitting: Nurse Practitioner

## 2020-09-12 DIAGNOSIS — Z0001 Encounter for general adult medical examination with abnormal findings: Secondary | ICD-10-CM | POA: Diagnosis not present

## 2020-09-12 DIAGNOSIS — Z1331 Encounter for screening for depression: Secondary | ICD-10-CM | POA: Diagnosis not present

## 2020-09-12 DIAGNOSIS — Z6825 Body mass index (BMI) 25.0-25.9, adult: Secondary | ICD-10-CM | POA: Diagnosis not present

## 2020-09-12 DIAGNOSIS — R0989 Other specified symptoms and signs involving the circulatory and respiratory systems: Secondary | ICD-10-CM | POA: Diagnosis not present

## 2020-09-12 DIAGNOSIS — E663 Overweight: Secondary | ICD-10-CM | POA: Diagnosis not present

## 2020-09-12 DIAGNOSIS — Z1389 Encounter for screening for other disorder: Secondary | ICD-10-CM | POA: Diagnosis not present

## 2020-09-12 DIAGNOSIS — I10 Essential (primary) hypertension: Secondary | ICD-10-CM | POA: Diagnosis not present

## 2020-09-12 DIAGNOSIS — E7849 Other hyperlipidemia: Secondary | ICD-10-CM | POA: Diagnosis not present

## 2020-09-17 ENCOUNTER — Other Ambulatory Visit: Payer: Self-pay | Admitting: Family Medicine

## 2020-09-17 ENCOUNTER — Other Ambulatory Visit (HOSPITAL_COMMUNITY): Payer: Self-pay | Admitting: Family Medicine

## 2020-09-17 DIAGNOSIS — R0989 Other specified symptoms and signs involving the circulatory and respiratory systems: Secondary | ICD-10-CM

## 2020-09-17 DIAGNOSIS — E2839 Other primary ovarian failure: Secondary | ICD-10-CM

## 2020-10-02 ENCOUNTER — Ambulatory Visit (HOSPITAL_COMMUNITY)
Admission: RE | Admit: 2020-10-02 | Discharge: 2020-10-02 | Disposition: A | Discharge status: Home / Self Care | Payer: Medicare Other | Source: Ambulatory Visit | Attending: Family Medicine | Admitting: Family Medicine

## 2020-10-02 ENCOUNTER — Other Ambulatory Visit: Payer: Self-pay

## 2020-10-02 DIAGNOSIS — I6523 Occlusion and stenosis of bilateral carotid arteries: Secondary | ICD-10-CM | POA: Diagnosis not present

## 2020-10-02 DIAGNOSIS — R0989 Other specified symptoms and signs involving the circulatory and respiratory systems: Secondary | ICD-10-CM | POA: Insufficient documentation

## 2020-10-02 DIAGNOSIS — E2839 Other primary ovarian failure: Secondary | ICD-10-CM | POA: Diagnosis not present

## 2020-10-02 DIAGNOSIS — Z0389 Encounter for observation for other suspected diseases and conditions ruled out: Secondary | ICD-10-CM | POA: Diagnosis not present

## 2020-11-10 ENCOUNTER — Encounter: Payer: Self-pay | Admitting: Vascular Surgery

## 2020-11-10 ENCOUNTER — Other Ambulatory Visit: Payer: Self-pay

## 2020-11-10 ENCOUNTER — Ambulatory Visit (INDEPENDENT_AMBULATORY_CARE_PROVIDER_SITE_OTHER): Payer: Medicare Other | Admitting: Vascular Surgery

## 2020-11-10 VITALS — BP 106/85 | HR 72 | Temp 98.4°F | Resp 14 | Ht 61.0 in | Wt 133.0 lb

## 2020-11-10 DIAGNOSIS — I6521 Occlusion and stenosis of right carotid artery: Secondary | ICD-10-CM

## 2020-11-10 NOTE — Progress Notes (Signed)
[facility] [place]  Patient name: Jacqueline Weiss MRN: [id number] DOB: 09-20-1950 Sex: female  REASON FOR CONSULT: Evaluation carotid stenosis  HPI: Jacqueline Weiss is a 70 y.o. female, who is here today for evaluation.  She was found to have right carotid bruit on physical exam and underwent subsequent duplex suggesting moderate to severe right carotid stenosis.  She is here today for further discussion.  She is right-handed.  She denies any prior history of amaurosis fugax, aphasia, transient ischemic attack or stroke.  She is otherwise healthy.  She has never smoked.  Past Medical History:  Diagnosis Date  . Atypical nevus 01/22/2014   mild atypia - lower back (no treatment)  . Hypertension   . SCC (squamous cell carcinoma) in situ 11/10/2006   left lower leg - treated with Aldara cream    Family History  Problem Relation Age of Onset  . Colon cancer Neg Hx     SOCIAL HISTORY: Social History   Socioeconomic History  . Marital status: Married    Spouse name: Not on file  . Number of children: Not on file  . Years of education: Not on file  . Highest education level: Not on file  Occupational History  . Not on file  Tobacco Use  . Smoking status: Never Smoker  . Smokeless tobacco: Never Used  Substance and Sexual Activity  . Alcohol use: Yes    Comment: couple glasses of wine daily  . Drug use: No  . Sexual activity: Yes  Other Topics Concern  . Not on file  Social History Narrative  . Not on file   Social Determinants of Health   Financial Resource Strain: Not on file  Food Insecurity: Not on file  Transportation Needs: Not on file  Physical Activity: Not on file  Stress: Not on file  Social Connections: Not on file  Intimate Partner Violence: Not on file    No Known Allergies  Current Outpatient Medications  Medication Sig Dispense Refill  . aspirin EC 81 MG tablet Take 81 mg by mouth daily.     Marland Kitchen atenolol (TENORMIN) 25 MG tablet Take 25 mg by mouth 2 (two) times daily.    Marland Kitchen atorvastatin (LIPITOR) 10 MG tablet Take 1 tablet by mouth daily.    . hydrochlorothiazide (MICROZIDE) 12.5 MG capsule Take 12.5 mg by mouth daily.     No current facility-administered medications for this visit.    REVIEW OF SYSTEMS:  [X]  denotes positive finding, [ ]  denotes negative finding Cardiac  Comments:  Chest pain or chest pressure:    Shortness of breath upon exertion:    Short of breath when lying flat:    Irregular heart rhythm:        Vascular    Pain in calf, thigh, or hip brought on by ambulation:    Pain in feet at night that wakes you up from your sleep:     Blood clot in your veins:    Leg swelling:  x       Pulmonary    Oxygen at home:    Productive cough:     Wheezing:         Neurologic    Sudden weakness in arms or legs:     Sudden numbness in arms or legs:     Sudden onset of difficulty speaking or slurred speech:    Temporary loss of vision in one eye:     Problems with  dizziness:         Gastrointestinal    Blood in stool:     Vomited blood:         Genitourinary    Burning when urinating:     Blood in urine:        Psychiatric    Major depression:         Hematologic    Bleeding problems:    Problems with blood clotting too easily:        Skin    Rashes or ulcers:        Constitutional    Fever or chills:      PHYSICAL EXAM: Vitals:   11/10/20 1042  BP: 106/85  Pulse: 72  Resp: 14  Temp: 98.4 F (36.9 C)  TempSrc: Other (Comment)  SpO2: 99%  Weight: 133 lb (60.3 kg)  Height: 5\' 1"  (1.549 m)    GENERAL: The patient is a well-nourished female, in no acute distress. The vital signs are documented above. CARDIOVASCULAR: Soft right carotid bruit.  No left bruit.  2+ radial and 2+ dorsalis pedis pulses bilaterally PULMONARY: There is good air exchange  MUSCULOSKELETAL: There are no major deformities or cyanosis. NEUROLOGIC: No focal weakness  or paresthesias are detected. SKIN: There are no ulcers or rashes noted. PSYCHIATRIC: The patient has a normal affect.  DATA:  Carotid duplex from [facility] from 10/02/2020 was reviewed.  This suggests 52 to 69% right carotid stenosis and no significant left carotid stenosis  MEDICAL ISSUES: I discussed the significance of this with the patient.  She is on aspirin and statin.  She will continue this.  I described signs and symptoms of carotid disease and she knows to report immediately to the emergency room should this occur.  I recommended repeat duplex in 1 year and office visit with me at that time and we will coordinate this.  I explained that if she does progress to greater than 80% stenosis, would recommend consideration of surgery for asymptomatic disease.  I did discuss surgical procedure of carotid endarterectomy.  We will see her again in 1 year for continued follow-up   Rosetta Posner, MD [facility] [phone] Pager [phone]  Note: Portions of this report may have been transcribed using voice recognition software.  Every effort has been made to ensure accuracy; however, inadvertent computerized transcription errors may still be present.

## 2021-04-15 ENCOUNTER — Inpatient Hospital Stay: Payer: MEDICARE | Primary: Internal Medicine

## 2021-04-23 ENCOUNTER — Inpatient Hospital Stay: Admit: 2021-04-23 | Discharge: 2021-04-23 | Payer: MEDICARE | Primary: Internal Medicine

## 2021-04-23 DIAGNOSIS — I69354 Hemiplegia and hemiparesis following cerebral infarction affecting left non-dominant side: Secondary | ICD-10-CM

## 2021-04-23 NOTE — Progress Notes (Signed)
Occupational Therapy  Name: Rachel Sandoval  [birth year]  Date: 04/23/2021  1:20 PM      Time In: 1320  Time Out: 1420  Billed Units: 8  CPT 97166: OT Low Eval units __1_  CPT 97537: OT Work Conditioning  units __7__  Diagnosis: CVA with left hemiplegia, September 2021  Prior Level of Functioning: Independent with ADL's, does light cooking, manages medications.  Has an aid that assist with driving to appointments, does laundry. __  Seizure free for 1 year: yes    Hearing Aids: no  Glasses/contacts: yes  Mobility Status: wheelchair.  She can stand pivot.  Discussed mobility once she gets to her destination.  She does not currently have a way to lift her wheelchair into her car and can only walk very short distances.    Is client able to transfer into car: yes can get in and out of her vehicle. She was able to stand from the hospital vehicle with min assist from the driver seat.  Pt stood from the passenger seat using [employer] with no assist.  Difficulty standing due to lower surface and no hand rails.  Pt's vehicle sits higher.  License # [id number]  Restrictions on License: corrective lenses  Expiration date: 04/09/22  Last time client drove: September 2021  Car Make/Model and transmission type: [employer], automatic  Driving Habits: Frequency: everyday  Night: yes  Snow: yes  Highway: ?yes  Traffic tickets in last 5 years: no  Accidents in last 5 years: no  Explain:    VISION:  Acuity: ([place] law =20/40 night driving; 25/36 day driving): ___64/40_____ with corrective lenses  Peripheral field: ([place] Law requires 70? visual field on both sides of a fixation point for a non-restricted license or 45? on the other side)  INTACT    Color Vision:  INTACT  Saccades: (ability to rapidly change fixation from one point in the visual field to another)    INTACT  Pursuits: (the continued fixation of a moving object)    INTACT  Depth Perception:    INTACT       COGNITION:  Trail Making Test Parts A & B (involve scanning, speed of  mental processing and visual motor sequencing.  Trail Making Part B involves switching cognitive sets too)  Trail Making Part A:   ____24.69____ seconds (Norm 60 seconds)  Trail Making Part B:   ____74.22____ seconds (Norm 180 seconds)          ROAD SIGN RECOGNITION:  ____9____/9 presented correctly identified    PHYSICAL:          Sensation: __WFL_______________________________    (exceptions to normal limits marked by "X" and explained)   AROM-  RIGHT AROM-  LEFT STRENGTH-RIGHT STRENGTH-LEFT   SHOULDER  x  x   ELBOW  x  x   WRIST  x  x   HAND  x  x   HIP       KNEE       ANKLE         Deficits explained: ________________________________________________________     UE- RIGHT UE- LEFT LE-RIGHT LE-LEFT   PROPRIOCEPTION       LIGHT TOUCH         COORDINATION:  ("X" to signify intact or impaired)     INTACT IMPAIRED   NECK ROM x    HEEL TO SHIN x    FINGER-NOSE-KNEE  x   SITTING BALANCE x    DIADOKOKINESIS  x  Client's Stated Goal: To be able to drive herself to doctor appointments and stores.    ON THE ROAD PERFORMANCE: She was able to drive in a church parking lot and also drove on roads back to the hospital.  She had one time where she veered too far to the left to avoid a [employer] vehicle requiring intervention.  She was able to react appropriately all other times.  She did not require any intervention with braking.  Introduced her to the spinner knob due to left side hemiplegia but she opted to steer without the spinner knob.  She did not require any intervention for curves or turns.  She was able to reach across the steering wheel to operate the turn signal.      RECOMMENDATIONS: will see for a second session to evaluate pt in heavier traffic, making turns into traffic, and on the expressway.  Pt reports in therapy has been working on left neglect which she did not demonstrate any today with driving but will further assess with heavier traffic.  Pt no longer receives OT services due to far drive where she was going  at [facility].  Gave her information to call and schedule here at [facility] which is close to her home.  She also was looking for a new PMR doctor to follow for her CVA and management of increased tone on her left side.  Pt given information for PMR group in the medical arts building at [facility].  Pt may also benefit from outpatient PT to attempt ambulating which would be beneficial for her to get to her car and also when she gets to her destination and to not rely on wheelchair which she cannot load.    LONG TERM GOALS FOR DRIVER TRAINING:  1.  Pt will complete driving on primary and secondary roads with no intervention required for steering or braking.  2.  Pt will complete regular parking with no verbal cues or intervention required.  3.  Pt will complete manueverability course/parallel parking with minimal verbal cues.  4.  Pt will drive on the expressway with no intervention required for steering or braking.  5.  Pt will complete safe lane changes with no verbal cues or intervention required.  6.  Pt will complete stop sign procedures and appropriate turn taking with no verbal cues or intervention required.  7.  Pt will complete unprotected left turns with oncoming traffic with no verbal cues or intervention required.  8.  Pt will demonstrate safe use of vehicle modifications. Specify equipment if applicable:         [name], COTA/L, CDRS, LDI   Certified Driver Rehabilitation Specialist

## 2021-04-24 DIAGNOSIS — R0989 Other specified symptoms and signs involving the circulatory and respiratory systems: Secondary | ICD-10-CM | POA: Diagnosis not present

## 2021-04-24 DIAGNOSIS — Z6825 Body mass index (BMI) 25.0-25.9, adult: Secondary | ICD-10-CM | POA: Diagnosis not present

## 2021-04-24 DIAGNOSIS — I6521 Occlusion and stenosis of right carotid artery: Secondary | ICD-10-CM | POA: Diagnosis not present

## 2021-04-24 DIAGNOSIS — E782 Mixed hyperlipidemia: Secondary | ICD-10-CM | POA: Diagnosis not present

## 2021-04-24 DIAGNOSIS — I10 Essential (primary) hypertension: Secondary | ICD-10-CM | POA: Diagnosis not present

## 2021-04-24 DIAGNOSIS — E663 Overweight: Secondary | ICD-10-CM | POA: Diagnosis not present

## 2021-05-13 ENCOUNTER — Inpatient Hospital Stay: Admit: 2021-05-13 | Discharge: 2021-05-14 | Payer: MEDICARE | Primary: Internal Medicine

## 2021-05-13 ENCOUNTER — Inpatient Hospital Stay: Admit: 2021-05-13 | Discharge: 2021-05-13 | Payer: MEDICARE | Primary: Internal Medicine

## 2021-05-13 DIAGNOSIS — I69354 Hemiplegia and hemiparesis following cerebral infarction affecting left non-dominant side: Secondary | ICD-10-CM

## 2021-05-13 NOTE — Progress Notes (Signed)
Physical Therapy  Progress Note  Attempted to reach pt via phone regarding appointment with Dr. Durwin Glaze, and referral for AFO.  Pt did not answer.  Left a voicemail requesting call back when able.   Electronically signed by Renita Papa, PT [phone] on 05/15/2021 at 8:42 AM

## 2021-05-13 NOTE — Progress Notes (Signed)
Occupational Therapy      Occupational Therapy Daily Treatment Note    Date:  05/14/2021    Patient Name:  Rachel Sandoval     DOB:  Sep 20, 1950  MRN: [id number]  Restrictions/Precautions:    Medical/Treatment Diagnosis Information:  Diagnosis: M62.81 (ICD-10-CM) - Muscle weakness (generalized)  I63.50 (ICD-10-CM) - Cerebral infarction due to unspecified occlusion or stenosis of unspecified cerebral artery  R26.81 (ICD-10-CM) - Unsteadiness on feet  R53.1 (ICD-10-CM) - Weakness  Treatment Diagnosis: decreased L UE ROM, strength, coordination, and ADLs/IADLs  Insurance/Certification information:  OT Insurance Information: AETNA MEDICARE  Physician Information:   Judyann Munson III, MD  Plan of care signed (Y/N):  N  Visit# / total visits:  1/8   Pain level: 0/10     G-Code (if applicable):      Date / Visit # G-Code Applied:  /   QuickDASH Total Score: 39 (05/13/21 1407)       QuickDASH Disability/Symptom Score : 63.64 % (05/13/21 1407)    Progress Note: [x]   Yes  []   No  Next due by: Visit #10      Subjective: Pt very agitated about current medical process since CVA.     Objective Measures:  Eval 05/14/21    Functional Status:  Functional Status  Occupation: Retired  Help From: Home health (Receives assist 2-3x/week from home health for transport to/from appointments.)  ADL Assistance:  (Supervision for sink bath.  Toileting independently.   Needs occasional assist for dressing)  Homemaking Assistance:  (Pt is able to microwave her own meals.  HHA does grocery shopping)  Ambulation Assistance: Needs assistance (Pt is primarily wc bound. Able to take several steps with grab bar support to get to toilet.  Does not use platform walker except for in therapy.)  Transfer Assistance: Independent  Active Driver: No (working with 14/8/22 to return to driving)  Objective:    Tone  LUE Tone: Hypertonic  Tone Description: significant noted tone throughout L UE including shoulder, elbow, wrist, and hand/fingers  Activity  Tolerance  Activity Tolerance: Patient tolerated evaluation without incident  Cognition  Overall Cognitive Status: WFL  LUE PROM (degrees)  LUE PROM: Exceptions  L Shoulder Flex  0-180: UTA  L Shoulder ABduction 0-180: UTA  L Elbow Flexion 0-145: WFL  L Elbow Extension 145-0: WFL  LUE AROM (degrees)  LUE AROM : Exceptions  LUE General AROM: seated position; unable to tolerate supine position  L Shoulder Flexion 0-180: 35  L Shoulder ABduction 0-180: 45  L Elbow Flexion 0-145: 40 AROM; up to 70degrees  L Forearm Pron 0-90: 0  L Forearm Supination  0-90: 0  L Wrist Flexion 0-80: 0  L Wrist Extension 0-70: 0  Left Hand AROM (degrees)  Left Hand AROM: Exceptions  Left Hand General AROM: able to complete grasp; unable to complete finger extension  RUE AROM (degrees)  RUE AROM : WFL  Right Hand AROM (degrees)  Right Hand AROM: WFL  LUE Strength  Gross LUE Strength: Exceptions to [facility]  L Shoulder Flex: 2-/5  L Shoulder Ext: 2-/5  L Shoulder ABduction: 2-/5  L Shoulder ADduction: 2-/5  L Elbow Flex: 2-/5  L Elbow Ext: 2-/5  L Hand General: 3+/5  LUE Strength Comment: limited by significant tone  RUE Strength  Gross RUE Strength: WFL  Hand Dominance  Hand Dominance: Right  Left Hand Strength - Grips (lbs)  Handle Setting 2: UTA  Therapeutic Exercise:   Exercise/Equipment  Resistance/Repetitions   Other comments   Exercises   supine/seated    Shoulder:  Flex/ext  AB/AD    Elbow:   Flex/ext    Forearm:   Pronation/supination    Wrist:  Flex/ext    Hand: grasp/release    Stretching   supine/seated    Shoulder:  Flex/ext  AB/AD    Elbow:   Flex/ext    Forearm:   Pronation/supination    Wrist:  Flex/ext    Hand: grasp/release           Home Exercise Program:        Manual Treatments:      Modalities:      Timed Code Treatment Minutes:  30 minutes (15 minute eval)    Total Treatment Minutes:  45 minutes (1355-1440)    Charges:  X1 Mod Eval  X2 Ther Ex    Treatment/Activity Tolerance:     []   Patient  tolerated treatment well []   Patient limited by fatigue    []   Patient limited by pain []   Patient limited by other medical complications   [x]   Other: Poor insight    Assessment: Pt is a 70 y.o. F. beginning OP OT 12/7 s/p CVA with left hemiplegia, September 2021.  PMH includes L knee OA.  Prior Level of Functioning: Independent with ADL's, does light cooking, manages medications.  Has an aid that assist with driving to appointments, does laundry. Pt presents to OP OT with complaints of ongoing L sided deficits. Pt demonstrates significant limited L UE ROM including shoulder, elbow, wrist, and hand. Pt primarily limited due to significant noted tone throughout L UE. Pt reports no sensation deficits in L UE. Pt with no no noted cognition deficits. Pt is ~15 months out from CVA. Pt has completed OP OT with [facility] over last 5 months. Per chart review, pt with difficulty with transportation and poor insight into CVA/deficit outlook moving forward. Pt reports she is unable to medications for noted tone and has had one round of botox, although is no longer getting. Discussed with pt need to address future outlook including use of adapting activities and using adaptive equipment in order to promote independence. Pt very agitated and frustrated with current medical situation and continues to have poor understanding of medical/physical outlook and anticipate progress with L UE with being ~15  months from CVA. Pt reports plan to schedule new MD appointment to discuss possible botox. Plan to follow up with pt after MD apppintment scheduled in hopes of timing therapy up with botx. Pt will benefit from skilled OT services at this time at a frequency of 1-2x a week for 4 weeks.    Prognosis: []   Good [x]   Fair  [x]   Poor    Goals:      Patient Goals       Patient goals  L UE to work   Short Term Goals      Time Frame for Short Term Goals 4 weeks   Short Term Goal 1 Pt will report completing HEP Ind and report completing daily    STG 1 Current Status: --   STG Goal 1 Status: --   Short Term Goal 2 Pt will decrease QuickDASH score by 3 points for increased ability to complete ADLs/IADLs   STG 2 Current Status: --   STG Goal 2 Status: --   Short Term Goal 3 Pt will increase L shoulder flexion AROM 10 degrees  for increased ability to assist with ADLs   STG 3 Current Status: --   STG Goal 3 Status: --   Short Term Goal 4 Pt will identify x3 activities she would like to become independent in, in order to address ongoing need for assist from aide   STG 4 Current Status: --   STG Goal 4 Status: --   Short Term Goal 5 Pt will demonstrate ability to complete all bathing and dressing Mod Ind with use of AD as needed   STG 5 Current Status: --   STG Goal 5 Status: --   Additional Goals?         Patient Requires Follow-up:  [x]   Yes  []   No    Plan: []   Continue per plan of care []   Alter current plan (see comments)   [x]   Plan of care initiated []   Hold pending MD visit []   Discharge    Plan for Next Session:      Electronically signed by:  , OT,OTR/L

## 2021-05-13 NOTE — Progress Notes (Signed)
Occupational Therapy     Outpatient Occupational Therapy  []  [facility]   Phone: [phone]   Fax: [phone]   [x]  [facility]  Phone: [phone]   Fax: [phone]  []  [phone]   Phone: [phone]   Fax: [phone]      To:  Dr. Romeo Apple III      Patient: Rachel Sandoval  DOB: 07/02/1950  MRN: Larence Penning  Evaluation Date: 05/14/2021      Diagnosis Information:  Diagnosis: M62.81 (ICD-10-CM) - Muscle weakness (generalized)  I63.50 (ICD-10-CM) - Cerebral infarction due to unspecified occlusion or stenosis of unspecified cerebral artery  R26.81 (ICD-10-CM) - Unsteadiness on feet  R53.1 (ICD-10-CM) - Weakness   OT Insurance Information: AETNA MEDICARE     Occupational Therapy Certification Form  Dear Dr. 13/08/1950 III,   The following patient has been evaluated for occupational therapy services and for therapy to continue, Medicare requires monthly physician review of the treatment plan. Please review the attached evaluation and/or summary of the patient's plan of care, and verify that you agree therapy should continue by signing the attached document and sending it back to our office.    Plan of Care/Treatment to date:  [x]  Therapeutic Exercise   [x]  Modalities:  [x]  Therapeutic Activity    []  Ultrasound  [x]  Electrical Stimulation   [x]  Activities of Daily Living    []  Fluidotherapy []  Kinesiotaping  [x]  Neuromuscular Re-education   []  Iontophoresis []  Coldpack/hotpack   [x]  Instruction in HEP     []  Contrast Bath  []  Manual Therapy     Other:  []  Aquatic Therapy      []        Frequency/Duration:  # Days per week: [x]  1 day # Weeks: []  1 week []  5 weeks      [x]  2 days   []  2 weeks []  6 weeks     []  3 days   []  3 weeks []  7 weeks     []  4 days   [x]  4 weeks []  8 weeks    Rehab Potential: []  excellent []  good [x]  fair  [x]  poor     Patient Goals       Patient goals  L UE to work   Short Term Goals      Time Frame for Short Term Goals 4 weeks   Short Term Goal 1 Pt will report completing HEP Ind and report  completing daily   STG 1 Current Status: --   STG Goal 1 Status: --   Short Term Goal 2 Pt will decrease QuickDASH score by 3 points for increased ability to complete ADLs/IADLs   STG 2 Current Status: --   STG Goal 2 Status: --   Short Term Goal 3 Pt will increase L shoulder flexion AROM 10 degrees for increased ability to assist with ADLs   STG 3 Current Status: --   STG Goal 3 Status: --   Short Term Goal 4 Pt will identify x3 activities she would like to become independent in, in order to address ongoing need for assist from aide   STG 4 Current Status: --   STG Goal 4 Status: --   Short Term Goal 5 Pt will demonstrate ability to complete all bathing and dressing Mod Ind with use of AD as needed   STG 5 Current Status: --   STG Goal 5 Status: --   Additional Goals?           Electronically signed by:  [id number],  OT,OTR/L      If you have any questions or concerns, please don't hesitate to call.  Thank you for your referral.      Physician Signature:________________________________Date:__________________  By signing above, therapist???s plan is approved by physician

## 2021-05-13 NOTE — Progress Notes (Signed)
Occupational Therapy  Occupational Therapy Initial Assessment  Date:  05/14/2021    Patient Name: Rachel Sandoval  MRN: [id number]     DOB:  March 26, 1951     Treatment Diagnosis: decreased L UE ROM, strength, coordination, and ADLs/IADLs    Restrictions:  Restrictions/Precautions  Restrictions/Precautions: Fall Risk  Position Activity Restriction  Other position/activity restrictions: L hemiplegia  Subjective:   General  Chart Reviewed: Yes  Patient assessed for rehabilitation services?: Yes  History obtained from:: Patient  Family/Caregiver Present: Yes (aide)  Diagnosis: M62.81 (ICD-10-CM) - Muscle weakness (generalized)  I63.50 (ICD-10-CM) - Cerebral infarction due to unspecified occlusion or stenosis of unspecified cerebral artery  R26.81 (ICD-10-CM) - Unsteadiness on feet  R53.1 (ICD-10-CM) - Weakness  Referring Provider (secondary): Judyann Munson III, MD  OT Visit Information  Onset Date: 05/13/21  OT Insurance Information: AETNA MEDICARE  Total # of Visits Approved:  (MED NEC)     Social History:   Social History  Lives With: Alone  Type of Home: House  Home Layout: Two level, Able to Live on Main level with bedroom/bathroom  Home Access: Ramped entrance  Bathroom Shower/Tub: Walk-in shower (Upstairs - does not use at this time.  Currently sink bathes)  Bathroom Toilet: Standard (3-in-1 commode sits over toilet)  Bathroom Equipment: 3-in-1 commode  Home Equipment: Wheelchair-manual, Walker, rolling, Lift chair (RW with platform; Sleeps in recliner chair)  Functional Status:  Functional Status  Occupation: Retired  Actor Help From: Home health (Receives assist 2-3x/week from home health for transport to/from appointments.)  ADL Assistance:  (Supervision for sink bath.  Toileting independently.   Needs occasional assist for dressing)  Homemaking Assistance:  (Pt is able to microwave her own meals.  HHA does grocery shopping)  Ambulation Assistance: Needs assistance (Pt is primarily wc bound. Able to take  several steps with grab bar support to get to toilet.  Does not use platform walker except for in therapy.)  Transfer Assistance: Independent  Active Driver: No (working with Barbara Cower to return to driving)     Objective:          Tone  LUE Tone: Hypertonic  Tone Description: significant noted tone throughout L UE including shoulder, elbow, wrist, and hand/fingers  Activity Tolerance  Activity Tolerance: Patient tolerated evaluation without incident              Cognition  Overall Cognitive Status: WFL                 LUE PROM (degrees)  LUE PROM: Exceptions  L Shoulder Flex  0-180: UTA  L Shoulder ABduction 0-180: UTA  L Elbow Flexion 0-145: WFL  L Elbow Extension 145-0: WFL  LUE AROM (degrees)  LUE AROM : Exceptions  LUE General AROM: seated position; unable to tolerate supine position  L Shoulder Flexion 0-180: 35  L Shoulder ABduction 0-180: 45  L Elbow Flexion 0-145: 40 AROM; up to 70degrees  L Forearm Pron 0-90: 0  L Forearm Supination  0-90: 0  L Wrist Flexion 0-80: 0  L Wrist Extension 0-70: 0  Left Hand AROM (degrees)  Left Hand AROM: Exceptions  Left Hand General AROM: able to complete grasp; unable to complete finger extension  RUE AROM (degrees)  RUE AROM : WFL  Right Hand AROM (degrees)  Right Hand AROM: WFL  LUE Strength  Gross LUE Strength: Exceptions to [facility]  L Shoulder Flex: 2-/5  L Shoulder Ext: 2-/5  L Shoulder ABduction: 2-/5  L Shoulder ADduction: 2-/5  L Elbow Flex: 2-/5  L Elbow Ext: 2-/5  L Hand General: 3+/5  LUE Strength Comment: limited by significant tone  RUE Strength  Gross RUE Strength: WFL     Hand Dominance  Hand Dominance: Right  Left Hand Strength - Grips (lbs)  Handle Setting 2: UTA          Assessment:    Assessment  Performance deficits / Impairments: Decreased functional mobility ;Decreased coordination;Decreased ADL status;Decreased ROM;Decreased strength;Decreased balance;Decreased high-level IADLs;Decreased fine motor control  Assessment: Pt is a 70 y.o. F. beginning OP OT 12/7  s/p CVA with left hemiplegia, September 2021.  PMH includes L knee OA.  Prior Level of Functioning: Independent with ADL's, does light cooking, manages medications.  Has an aid that assist with driving to appointments, does laundry. Pt presents to OP OT with complaints of ongoing L sided deficits. Pt demonstrates significant limited L UE ROM including shoulder, elbow, wrist, and hand. Pt primarily limited due to significant noted tone throughout L UE. Pt reports no sensation deficits in L UE. Pt with no no noted cognition deficits. Pt is ~15 months out from CVA. Pt has completed OP OT with [facility] over last 5 months. Per chart review, pt with difficulty with transportation and poor insight into CVA/deficit outlook moving forward. Pt reports she is unable to medications for noted tone and has had one round of botox, although is no longer getting. Discussed with pt need to address future outlook including use of adapting activities and using adaptive equipment in order to promote independence. Pt very agitated and frustrated with current medical situation and continues to have poor understanding of medical/physical outlook and anticipate progress with L UE with being ~15  months from CVA. Pt reports plan to schedule new MD appointment to discuss possible botox. Plan to follow up with pt after MD apppintment scheduled in hopes of timing therapy up with botx. Pt will benefit from skilled OT services at this time at a frequency of 1-2x a week for 4 weeks.  Treatment Diagnosis: decreased L UE ROM, strength, coordination, and ADLs/IADLs  Prognosis: Guarded;Fair  Decision Making: Medium Complexity  Occupational Profile/History : Medium  Assessment(s) that identifies: Medium  Assistance Modification: Medium  REQUIRES OT FOLLOW-UP: Yes  Activity Tolerance  Activity Tolerance: Patient tolerated evaluation without incident  PT Education  Barriers impacting rehab : Chronicity of problem;Medical co-morbidities;Other (comment)  (Agitation regarding current situation)       Plan:    Occupational Therapy Plan  Current Treatment Recommendations: ROM, Strengthening, Functional mobility training, Neuromuscular re-education, Equipment evaluation, education, & procurement, Patient/Caregiver education & training, Safety education & training, Self-Care / ADL, Balance training, Endurance training    G-Code:     OutComes Score:                 QuickDASH Total Score: 39 (05/13/21 1407)       QuickDASH Disability/Symptom Score : 63.64 % (05/13/21 1407)               Goals:     Patient Goals      Patient goals  L UE to work   Short Term Goals     Time Frame for Short Term Goals 4 weeks   Short Term Goal 1 Pt will report completing HEP Ind and report completing daily   STG 1 Current Status: --   STG Goal 1 Status: --   Short Term Goal 2 Pt will decrease QuickDASH score by 3 points for  increased ability to complete ADLs/IADLs   STG 2 Current Status: --   STG Goal 2 Status: --   Short Term Goal 3 Pt will increase L shoulder flexion AROM 10 degrees for increased ability to assist with ADLs   STG 3 Current Status: --   STG Goal 3 Status: --   Short Term Goal 4 Pt will identify x3 activities she would like to become independent in, in order to address ongoing need for assist from aide   STG 4 Current Status: --   STG Goal 4 Status: --   Short Term Goal 5 Pt will demonstrate ability to complete all bathing and dressing Mod Ind with use of AD as needed   STG 5 Current Status: --   STG Goal 5 Status: --   Additional Goals?           Therapy Time:   Individual Concurrent Group Co-treatment   Time In 1355         Time Out 1440         Minutes 45         Timed Code Treatment Minutes: 30 Minutes (15 minute eval)       Cornelia Copa, OTR/L

## 2021-05-13 NOTE — Plan of Care (Signed)
[facility]  [facility]  [address] [place] [address]  Dept: [phone]  Loc: [phone]    PHYSICAL THERAPY PLAN OF CARE: PLAN OF CARE   Patient: Rachel Sandoval (70 y.o. female)   Examination Date: 05/13/2021  Plan of Care Certification Period: 05/13/2021 to 08/11/21      DOB:  January 16, 1951  MRN: [id number]  CSN: [id number]   Insurance: Payor: AET[employer] MEDICARE / Plan: Monia Pouch MEDICARE ADVANTAGE HMO / Product Type: Medicare /   Insurance ID: [id number] - (Medicare Managed) Secondary Insurance (if applicable):    Referring Physician: Gena Fray, MD [facility] III, MD   PCP: Gena Fray, MD Visits to Date/Visits Approved: 0 /  (Medical Necessity)    No Show/Cancelled Appts:   /       Medical Diagnosis: Muscle weakness (generalized) [M62.81]  Cerebral infarction due to unspecified occlusion or stenosis of unspecified cerebral artery [I63.50]  Unsteadiness on feet [R26.81]  Weakness [R53.1] M62.81 (ICD-10-CM) - Muscle weakness (generalized)  I63.50 (ICD-10-CM) - Cerebral infarction due to unspecified occlusion or stenosis of unspecified cerebral artery  R26.81 (ICD-10-CM) - Unsteadiness on feet  R53.1 (ICD-10-CM) - Weakness  No data recorded     ASSESSMENT     Impression:Assessment: Pt is a 70 y.o. F. beginning OP PT s/p CVA ~ 14 months ago resulting in L hemiplegia.  Pt presents with severely limited LUE motion, mild-moderately limited L hip/knee motion, and severely limited L ankle AROM.  Pt is essentially wc bound at this time, though she is able to complete pivot transfers independently.  She has a platform walker, but has not been using it outside of therapy.  PT noted that functionally, pt's L ankle AROM is limited to coupled ankle DF and Inversion, compromising her ability to achieve foot-flat during WB, increasing her risk for ankle sprain/fx.  At this time, PT recommends therapy 1-2x/week for 6-8 weeks to improve LLE strength and  coordination, assist pt with accessing an AFO to provide ankle stability, and to maximize her independence ambulating.  Will continue to assess for additional equipment needs.    Body Structures, Functions, Activity Limitations Requiring Skilled Therapeutic Intervention: Decreased functional mobility , Decreased ROM, Decreased strength, Decreased ADL status, Decreased body mechanics, Decreased endurance, Decreased balance, Decreased fine motor control    Statement of Medical Necessity: Physical Therapy is both indicated and medically necessary as outlined in the POC to increase the likelihood of meeting the functionally related goals stated below.     Patient's Activity Tolerance: Patient tolerated evaluation without incident        Patient's rehabilitation potential/prognosis is considered to be:      Factors which may impact rehabilitation potential include: Chronicity of problem, Medical co-morbidities, Other (comment) (Agitation regarding current situation)        GOALS     Patient Goal(s): To regain use of her arm, and walk rather than use a wheelchair  Short Term Goals Completed by 3-4 weeks Goal Status   Demonstrate independence with HEP to maximize L hip, knee, and ankle strength to facilitate improved balance and independence ambulating New   Ambulate 150' with LRAD, SBA, to demonstrate improved endurance, and stability ambulating community distances New   Ascend/Descend 4 steps with Min A to facilitate improved independence with household mobility New  Long Term Goals Completed by 6-8 weeks Goal Status   Complete TUG in 60 s. or less to facilitate improved speed and independence with household ambulation New   Improve L hip and knee strength to grossly 4/5 to facilitate improved balance and independence with transfers and ambulation New   Ambulate at least 250' with AFO LLE, walker or cane, (I), to facilitate improved independence with community mobility New                                                   TREATMENT PLAN       Requires PT Follow-Up: Yes  Specific Instructions for Next Treatment: Obtain referral for AFO; LLE strengthening    Pt. actively involved in establishing Plan of Care and Goals: Yes  Patient/ Caregiver education and instruction:PT Role, Goals, Family Education, Plan of Care, Evaluative findings, Equipment, Home Exercise Program, Precautions, Teacher, early years/pre, IT sales professional, Orientation, Investment banker, operational, Geneticist, molecular, Fall prevention strategies, Disease Specific Education, Injury Prevention             Treatment may include any combination of the following: Current Treatment Recommendations: Strengthening, ROM, Gait training, Balance training, Functional mobility training, Neuromuscular re-education, Transfer training, Stair training, Wheelchair mobility training, ADL/Self-care training, Cognitive/Perceptual training, Endurance training, Home exercise program, Safety education & training, Patient/Caregiver education & training, Equipment evaluation, education, & procurement, Therapeutic activities, Manual, Pain management, Positioning, Modalities     Frequency / Duration:  Patient to be seen 1-2x/week for 6-8 weeks weeks         Physical Therapy Re-Certification Plan of Care/MD UPDATE      Dear Rachel Sandoval, Rachel Sandoval *,    We had the pleasure of treating the following patient for physical therapy services at [facility] and Therapy.  A summary of our findings can be found in the updated assessment below.  This includes our plan of care.  If you have any questions or concerns regarding these findings, please do not hesitate to contact me at the office phone number checked above.  Thank you for the referral.     Physician Signature:________________________________Date:__________________  By signing above (or electronic signature), therapist???s plan is approved by physician    Date Range Of Visits: 1  Total Visits to  Date: 0  Overall Response to Treatment:   [] Patient is responding well to treatment and improvement is noted with regards  to goals   [] Patient should continue to improve in reasonable time if they continue HEP   [] Patient has plateaued and is no longer responding to skilled PT intervention    [] Patient is getting worse and would benefit from return to referring MD   [] Patient unable to adhere to initial POC   [x] Other: POC just initiated.  Too soon to determine pt response to treatment.       Recommendation:    [x] Continue PT 1-2x / wk for 6-8 weeks.    [] Hold PT, pending MD visit        Patient Status: [x]  Continue / Initiate Plan of Care         Signature: Electronically signed by , PT [phone] on 05/13/2021 at 4:28 PM.     If you have any questions or concerns, please don't hesitate to call.  Thank you for your referral!

## 2021-05-13 NOTE — Progress Notes (Signed)
Physical Therapy: Initial Evaluation    Patient: Rachel Sandoval (70 y.o. female)   Examination Date: 05/13/2021  Plan of Care Certification Period:05/13/2021 to 08/11/21      DOB:  [birth year] ;   DOB Confirmed: Yes MRN: [id number]  CSN: [id number]   Insurance: Payor: Monia Pouch MEDICARE / Plan: Monia Pouch MEDICARE ADVANTAGE HMO / Product Type: Medicare /   Insurance ID: [id number] - (Medicare Managed) Secondary Insurance (if applicable):    Referring Physician: Gena Fray, MD [facility] III, MD   PCP: Gena Fray, MD Visits to Date/Visits Approved: 0 /  (Medical Necessity)    No Show/Cancelled Appts:   /       Medical Diagnosis: Muscle weakness (generalized) [M62.81]  Cerebral infarction due to unspecified occlusion or stenosis of unspecified cerebral artery [I63.50]  Unsteadiness on feet [R26.81]  Weakness [R53.1] M62.81 (ICD-10-CM) - Muscle weakness (generalized)  I63.50 (ICD-10-CM) - Cerebral infarction due to unspecified occlusion or stenosis of unspecified cerebral artery  R26.81 (ICD-10-CM) - Unsteadiness on feet  R53.1 (ICD-10-CM) - Weakness  Treatment Diagnosis:       PERTINENT MEDICAL HISTORY   Patient Assessed for Rehabilitation Services: Yes       Medical History: Chart Reviewed: Yes  No past medical history on file.  Surgical History: No past surgical history on file.    Medications: No current outpatient medications on file.  Allergies: Patient has no allergy information on record.       SUBJECTIVE EXAMINATION     History obtained from:: Chart Review,      Family/Caregiver Present: Yes (Aide present for session)    Subjective History:Onset Date: 05/13/21  Pt reports pain in L knee with persistent activity.    Additional Pertinent Hx (if applicable): Pt is a 70 y.o. F. beginning OP PT 12/7 s/p CVA with left hemiplegia, September 2021.  PMH includes L knee OA.  Prior Level of Functioning: Independent with ADL's, does light cooking, manages medications.  Has an aid that assist  with driving to appointments, does laundry.             Learning/Language:       Pain Screening         Functional Status         Social History:    Social History  Lives With: Alone  Type of Home: House  Home Layout: Two level, Able to Live on Main level with bedroom/bathroom  Home Access: Ramped entrance  Bathroom Shower/Tub: Walk-in shower (Upstairs - does not use at this time.  Currently sink bathes)  Bathroom Toilet: Standard (3-in-1 commode sits over toilet)  Bathroom Equipment: 3-in-1 commode  Home Equipment: Wheelchair-manual, Environmental consultant, rolling, Lift chair (RW with platform; Sleeps in recliner chair)    Occupation/Interests:  Occupation: Retired    Prior Level of Function:    Independent        Current Level of Function:         Receives Help From: Home health (Receives assist 2-3x/week from home health for transport to/from appointments.)  ADL Assistance:  (Supervision for sink bath.  Toileting independently.   Needs occasional assist for dressing)  Homemaking Assistance:  (Pt is able to microwave her own meals.  HHA does grocery shopping)  Ambulation Assistance: Needs assistance (Pt is primarily wc bound. Able to take several steps with grab bar support to get to toilet.  Does not use platform walker except for in therapy.)  Transfer Assistance: Independent  Active Driver: No  OBJECTIVE EXAMINATION   Restrictions:        Position Activity Restriction  Other position/activity restrictions: L hemiplegia     Review of Systems:  Vision: Impaired  Visual Deficits: Wears glasses  Hearing: Within functional limits  Overall Orientation Status: Within Normal Limits  Follows Commands: Within Functional Limits    Observations:       Mobility:   Transfers  Sit to Stand: Modified independent  Stand to Sit: Modified independent  Stand Pivot Transfers: Modified independent    Ambulation/Gait (if applicable):  Ambulation  Surface: Level tile  Device: Platform Walker left, Agricultural consultant  Assistance: Contact guard  assistance, Stand by assistance  Quality of Gait: Moderate pace, step-through pattern, limited LLE step length.  PT noted pt is unable to achieve any ankle DF AROM without inverting heavily, causing her to place body weight through the lateral border of her foot with each step (high risk for sprain/ankle fx).  PT noted this, but pt was unable to correct in spite of cueing.  Ambulation tolerance limited d/t fatigue.  Distance: 27'    Balance Screen:       Neuro Screen:  Quality of Movement     Tone  LLE Tone: Hypertonic, Clonus  Tone Description: Minimal motion of L ankle noted d/t hypertonicity.  PT noted trace ankle DF, EVE, and PF.  The only functional motion pt demonstrates is coupled INV/DF. Motor Control  Gross Motor?: Exceptions  Comments: Minimal LUE control noted during screen.  L hip and knee AROM is mildly to moderately limited, while L ankle AROM is severely limited.         ROM:   Left AROM  Right AROM         AROM LLE (degrees)  LLE General AROM: Hip Flex moderately limited, Knee Flex/Ext WFL.  Ankle AROM is minimal (Pt able to achieve INV/EVE AROM from 15-30 degrees of INV.  Able to achieve neutral INV/EVE passively.  Pt able to achieve 15-25 degrees of PF AROM.  Minimal DF AROM noted.  Pt able to achieve neutral ankle PF/DF passively).    AROM RLE (degrees)  RLE AROM: WFL  RLE General AROM: hip Flex, Knee Flex/Ext, and Ankle PF/DF WFL       Left Strength  Right Strength         Strength LLE  Strength LLE: Exception  L Hip Flexion: 3-/5  L Knee Flexion: 3-/5  L Knee Extension: 3+/5  L Ankle Dorsiflexion: 1+/5  L Ankle Plantar Flexion: 1+/5  L Ankle Inversion: 3/5  L Ankle Eversion: 1+/5    Strength RLE  Strength RLE: WFL  Comment: Hip Flex, Knee Flex/Ext, and Ankle PF/DF WFL          ASSESSMENT     Impression:Assessment: Pt is a 70 y.o. F. beginning OP PT s/p CVA ~ 14 months ago resulting in L hemiplegia.  Pt presents with severely limited LUE motion, mild-moderately limited L hip/knee motion, and  severely limited L ankle AROM.  Pt is essentially wc bound at this time, though she is able to complete pivot transfers independently.  She has a platform walker, but has not been using it outside of therapy.  PT noted that functionally, pt's L ankle AROM is limited to coupled ankle DF and Inversion, compromising her ability to achieve foot-flat during WB, increasing her risk for ankle sprain/fx.  At this time, PT recommends therapy 1-2x/week for 6-8 weeks to improve LLE strength and coordination, assist pt with accessing  an AFO to provide ankle stability, and to maximize her independence ambulating.  Will continue to assess for additional equipment needs.    Body Structures, Functions, Activity Limitations Requiring Skilled Therapeutic Intervention: Decreased functional mobility , Decreased ROM, Decreased strength, Decreased ADL status, Decreased body mechanics, Decreased endurance, Decreased balance, Decreased fine motor control    Statement of Medical Necessity: Physical Therapy is both indicated and medically necessary as outlined in the POC to increase the likelihood of meeting the functionally related goals stated below.     Patient's Activity Tolerance: Patient tolerated evaluation without incident        Patient's rehabilitation potential/prognosis is considered to be:      Factors which may impact rehabilitation potential include: Chronicity of problem, Medical co-morbidities, Other (comment) (Agitation regarding current situation)        GOALS     Patient Goal(s): To regain use of her arm, and walk rather than use a wheelchair  Short Term Goals Completed by 3-4 weeks Goal Status   Demonstrate independence with HEP to maximize L hip, knee, and ankle strength to facilitate improved balance and independence ambulating New   Ambulate 150' with LRAD, SBA, to demonstrate improved endurance, and stability ambulating community distances New   Ascend/Descend 4 steps with Min A to facilitate improved independence  with household mobility New                                                 Long Term Goals Completed by 6-8 weeks Goal Status   Complete TUG in 60 s. or less to facilitate improved speed and independence with household ambulation New   Improve L hip and knee strength to grossly 4/5 to facilitate improved balance and independence with transfers and ambulation New   Ambulate at least 250' with AFO LLE, walker or cane, (I), to facilitate improved independence with community mobility New                                                  TREATMENT PLAN       Requires PT Follow-Up: Yes  Specific Instructions for Next Treatment: Obtain referral for AFO; LLE strengthening    Pt. actively involved in establishing Plan of Care and Goals: Yes  Patient/ Caregiver education and instruction:PT Role, Goals, Family Education, Plan of Care, Evaluative findings, Equipment, Home Exercise Program, Precautions, Teacher, early years/pre, IT sales professional, Orientation, Investment banker, operational, Geneticist, molecular, Fall prevention strategies, Disease Specific Education, Injury Prevention             Treatment may include any combination of the following: Current Treatment Recommendations: Strengthening, ROM, Gait training, Balance training, Functional mobility training, Neuromuscular re-education, Transfer training, Stair training, Wheelchair mobility training, ADL/Self-care training, Cognitive/Perceptual training, Endurance training, Home exercise program, Safety education & training, Patient/Caregiver education & training, Equipment evaluation, education, & procurement, Therapeutic activities, Manual, Pain management, Positioning, Modalities     Frequency / Duration:  Patient to be seen 1-2x/week for 6-8 weeks weeks       Eval Complexity: Overall Evaluation : Medium  Decision Making: Medium Complexity  History: Personal Factors and/or Comorbidities Impacting POC: Medium  Examination of body system(s) including body structures and functions, activity  limitations, and/or  participation restrictions: Medium  Clinical Presentation: Medium  Clinical Decision Making : Medium Complexity      Therapy Time  Individual Time In: 1320       Individual Time Out: 1400  Minutes: 40   Medium Complexity Evaluation     Therapist Signature: Renita Papa, [place]  [id number]   Date: 24/09/100     I certify that the above Therapy Services are being furnished while the patient is under my care. I agree with the treatment plan and certify that this therapy is necessary.      Physician's Signature:  ___________________________   Date:_______                                                                   Judyann Munson *        Physician Comments: _______________________________________________    Please sign and return to [facility].  Please fax to the location listed below. [facility] YOU for this referral!    [facility]  [facility]  [address] [place] [address]  Dept: [phone]  Loc: [phone]       POC NOTE

## 2021-05-18 ENCOUNTER — Inpatient Hospital Stay: Admit: 2021-05-18 | Discharge: 2021-05-18 | Payer: MEDICARE | Primary: Internal Medicine

## 2021-05-18 NOTE — Progress Notes (Signed)
05/18/2021    Dear Dr. Larence Penning,      Rachel Sandoval (MR# [id number]) was seen by Occupational Therapy on 04/23/21, and 05/18/2021 for a Driver???s Evaluation at [facility].  As you know, Rachel Sandoval suffered a CVA resulting in left hemiplegia.  She wants to resume driving to be independent in community mobility and leisure skills.    Rachel Sandoval passed tests for visual acuity, saccades, pursuits, depth perception, color vision, peripheral vision and recognition of traffic signs and signals.  She was administered the Trails Making Tests Part A and B which are cognitive tests that involve scanning, speed of mental processing, visual motor sequencing, as well as switching cognitive sets.  On Part A, she scored within normal limits with 24.69 seconds over a norm of 60 seconds and on Part B, she scored within normal limits with 74.22 seconds over the norm of 180 seconds.  She demonstrated functional physical capacity for driving.    Behind the wheel, Rachel Sandoval was able to manipulate all vehicle controls including steering with only one hand.  She drove on secondary and primary roads in light to moderately heavy traffic.  She demonstrated the ability to park, back up, maintain speed and traffic flow, change lanes and follow directions.  Responses to situations encountered were appropriate.      Rachel Sandoval is limited at this time due to having to use a wheelchair for mobility.  She is unable to load her wheelchair into the vehicle without assistance which is going to limit her ability to mobilize once she drives to a destination.  It was recommended she continue outpatient physical therapy to address ambulation as this will be increase her ability to be mobile once driving to a destination.      Based on the results of this evaluation, it appears that Rachel Sandoval is a suitable candidate to resume driving.  She is as safe as the driving public.  The final decision remains with the physician.  Please inform Rachel Sandoval of  your decision.  I can be reached at [phone] if questions arise.  Thank you for this referral.    Sincerely,      [name], CDRS, LDI   Certified Driver Rehabilitation Specialist

## 2021-05-18 NOTE — Progress Notes (Signed)
Occupational Therapy  Occupational Therapy Driver Rehabilitation Daily Treatment Note    Rachel Sandoval  11-18-1950   [id number]      05/18/2021  3:13 PM  No current outpatient medications on file.     No current facility-administered medications for this encounter.       Diagnosis: CVA  Treatment diagnosis:  left hemiplegia  Insurance/Certification Information: [employer] Medicare  Physician Information: Dr. Larence Sandoval      Subjective: Pt reports she has not been able to get [employer] to call her back about an AFO.  She has an appointment with Dr. Coralee Pesa later this month to assess for Botox injections.    Objective Observations related to Long Term Goals:    1.  Pt will complete driving on primary and secondary roads with no intervention required for steering or braking.  She was able to drive on primary roads with moderate traffic.  She reacted appropriately in traffic and did not require any intervention for braking.  She stopped for a school bus on the left and maintained her lane which demonstrated she was scanning to her left.  She changed speeds appropriately when going through school zones.   2.  Pt will complete regular parking with no verbal cues or intervention required. She parked in a crowded store parking lot and was able to back out of the space without intervention.   3.  Pt will complete manueverability course/parallel parking with minimal verbal cues. NA   4.  Pt will drive on the expressway with no intervention required for steering or braking. She was able to enter the expressway, maintain speed and her lane, and exit the expressway without any intervention.   5.  Pt will complete safe lane changes with no verbal cues or intervention required. She completed with moderate traffic present without any intervention.   6.  Pt will complete stop sign procedures and appropriate turn taking with no verbal cues or intervention required. Completed with no intervention.   7.  Pt will complete unprotected left turns with  oncoming traffic with no verbal cues or intervention required. Pt demonstrated good judgement with oncoming traffic and completed with no intervention required.   8.  Pt will demonstrate safe use of vehicle modifications. Specify equipment if applicable:                                                                    Assessment:    Pt demonstrated ability to operate the vehicle in traffic without any intervention.  Pt is a candidate to resume driving.  She does have a barrier for community mobility at this point as she relies on a wheelchair and will be unable to load the wheelchair into her vehicle.  She reports she has a scooter and also a rack for her scooter but she does not like to use as she has trouble steering one handed.  After speaking with her outpatient PT he believes with an AFO she may be ambulatory.  Spoke with pt and she is motivated to be able to walk  again.  Educated pt that in order to be mobile in the community and get into stores after driving she will need to be able to ambulate from the car to the store.  Pt will be participating in outpatient PT and also OT as she also wants to gain function in her left UE.    Time In: 1300    Time Out: 1500    Timed Code Treatment Minutes: 120    Total Treatment Minutes: 120    Billed Units: 8    CPT: 97537 OT Work Conditioning Units: ___8___    Treatment/Activity Tolerance: good    Prognosis: good    Patient Requires Follow-up: no    Plan:   Continue per plan of care:___________   [name] current plan:_________   Discharge:_______x_______      [name], COTA/L, CDRS, LDI   Certified Driver Rehabilitation Specialist

## 2021-06-17 ENCOUNTER — Inpatient Hospital Stay: Admit: 2021-06-17 | Discharge: 2021-06-17 | Payer: MEDICARE | Primary: Physical Medicine & Rehabilitation

## 2021-06-17 DIAGNOSIS — I69354 Hemiplegia and hemiparesis following cerebral infarction affecting left non-dominant side: Secondary | ICD-10-CM

## 2021-06-17 NOTE — Progress Notes (Signed)
Physical Therapy: Daily Note   Patient: Rachel Sandoval (71 y.o. female)   Examination Date: 10/96/0454  Plan of Care/Certification Expiration Date: 08/11/21    Progress Note Counter: 2     DOB:  [birth year] # of Visits since [facility]: 1     MRN: [id number]  CSN: [id number] Start of Care Date:      Insurance: Payor: AETNA MEDICARE / Plan: [employer] / Product Type: Medicare /   Insurance ID: [id number] - (Medicare Managed) Secondary Insurance (if applicable):    Referring Physician: Elnora Morrison, MD Joseph Art, MD   PCP: Elnora Morrison, MD Visits to Date/Visits Approved: 1 /  (Medical Necessity)    No Show/Cancelled Appts:   /       Medical Diagnosis: Spastic hemiplegia affecting left nondominant side [G81.14] M62.81 (ICD-10-CM) - Muscle weakness (generalized)  I63.50 (ICD-10-CM) - Cerebral infarction due to unspecified occlusion or stenosis of unspecified cerebral artery  R26.81 (ICD-10-CM) - Unsteadiness on feet  R53.1 (ICD-10-CM) - Weakness  No data recorded      SUBJECTIVE EXAMINATION   Pain Level:  0/10 at rest.    Patient Comments: Subjective: Pt has appointment scheduled with [facility].  Met with Dr. Kathleene Hazel who is recommending botox injections.  C/o L knee pain with activity.  Has not exercise much.    HEP Compliance: N/A        OBJECTIVE EXAMINATION   Restrictions:  Restrictions/Precautions: Fall Risk   No data recorded No data recorded        TREATMENT     Exercises:  Therapeutic exercise (CPT 97110)   Treatment Reasoning    Exercise 1: NuStep level 3 x 5 min.  Exercise 2: Ambulation 39' with platform walker, CGA-SBA, slow speed, no LOB, limited d/t fatigue and L knee discomfort.  Exercise 3: Standing: L hip Ext stretch x 30 s., L hip ABD stretch x 30 s.  Exercise 4: Ambulation 37' with platform walker, SBA, wc follow, several standing rest breaks, total standing time 6.5 min.  Exercise 5: Standing with RUE support:  mini-squat x 20, lateral weight shift x 10, A/P weight shift x  10.  Exercise 6: Written HEP for mini-squats, lateral and A/P weight shift provided.  Pt encouraged to increase standing tolerance (goal of at least 7 min. consecutive).    Limitations addressed: Mobility, Strength, Coordination, Balance, Activity tolerance  Therapist provided: Assistance, Verbal cuing, Manual cuing   1:1 Time (minutes): 30                 Pt Education: PT Education: PT Role, Goals, Family Education, Plan of Care, Evaluative findings, Equipment, Home Exercise Program, Precautions, Training and development officer, Heritage manager, Orientation, Personnel officer, Adaptive Device Training, Fall prevention strategies, Disease Specific Education, Injury Prevention       ASSESSMENT     Assessment: Assessment: Pt able to ambulate moderate distance several trials with platform walker.  Standing tolerance is near to 7 min./bout.  She would benefit from continued therapy to gradually improve endurance, strength, balance, coordination, independence transferring/ambulating.  Body Structures, Functions, Activity Limitations Requiring Skilled Therapeutic Intervention: Decreased functional mobility , Decreased ROM, Decreased strength, Decreased ADL status, Decreased body mechanics, Decreased endurance, Decreased balance, Decreased fine motor control    Post-Treatment Pain Level:      Activity Tolerance: Patient tolerated treatment well    No data recorded     GOALS   Patient Goals : To regain use of her arm, and walk rather  than use a wheelchair  Short Term Goals Completed by 3-4 weeks Current Status Goal Status   Demonstrate independence with HEP to maximize L hip, knee, and ankle strength to facilitate improved balance and independence ambulating   New   Ambulate 150' with LRAD, SBA, to demonstrate improved endurance, and stability ambulating community distances   New   Ascend/Descend 4 steps with Min A to facilitate improved independence with household mobility   New                                                                Long Term Goals Completed by 6-8 weeks Current Status Goal Status   Complete TUG in 60 s. or less to facilitate improved speed and independence with household ambulation   New   Improve L hip and knee strength to grossly 4/5 to facilitate improved balance and independence with transfers and ambulation   New   Ambulate at least 250' with AFO LLE, walker or cane, (I), to facilitate improved independence with community mobility   New                                                                TREATMENT PLAN   Plan Frequency: 1-2x/week  Plan weeks: 6-8 weeks  Current Treatment Recommendations: Strengthening, ROM, Gait training, Balance training, Functional mobility training, Neuromuscular re-education, Transfer training, Stair training, Wheelchair mobility training, ADL/Self-care training, Cognitive/Perceptual training, Endurance training, Home exercise program, Safety education & training, Patient/Caregiver education & training, Equipment evaluation, education, & procurement, Therapeutic activities, Manual, Pain management, Positioning, Modalities   Specific Instructions for Next Treatment: HEP; AFO appointment; endurance and LLE strengthening       Therapy Time  Individual Time In: 1300       Individual Time Out: 1345  Minutes: 45  Timed Code Treatment Minutes: 30 Minutes  Therapeutic Exercise x 2 charges     Electronically signed by Jorja Loa, PT [phone]  on 06/17/2021 at 1:53 PM   POC NOTE

## 2021-06-17 NOTE — Progress Notes (Signed)
Occupational Therapy      Occupational Therapy Daily Treatment Note    Date:  06/17/2021    Patient Name:  Rachel Sandoval     DOB:  [birth year]  MRN: [id number]    Restrictions/Precautions:    Medical/Treatment Diagnosis Information:  Diagnosis: M62.81 (ICD-10-CM) - Muscle weakness (generalized)  I63.50 (ICD-10-CM) - Cerebral infarction due to unspecified occlusion or stenosis of unspecified cerebral artery  R26.81 (ICD-10-CM) - Unsteadiness on feet  R53.1 (ICD-10-CM) - Weakness  Treatment Diagnosis: decreased L UE ROM, strength, coordination, and ADLs/IADLs  Insurance/Certification information:  OT Insurance Information: AETNA MEDICARE  Physician Information:   Judyann Munson III, MD  Plan of care signed (Y/N):  N  Visit# / total visits:  2/8   Pain level: 0/10     G-Code (if applicable):      Date / Visit # G-Code Applied:  /   QuickDASH Total Score: 39 (05/13/21 1407)       QuickDASH Disability/Symptom Score : 63.64 % (05/13/21 1407)    Progress Note: []   Yes  [x]   No  Next due by: Visit #10      Subjective: Pt agreeable to OT treatment. Pt reports awaiting insurance approval for botox. Pt abruptly ending session early this date due to reported light headedness and fatigue. Tried to discuss with pt need for finishing session and only completing UE stretching although pt reporting needing to go home and sleep.     Objective Measures:  Eval 05/14/21    Functional Status:  Functional Status  Occupation: Retired  Help From: Home health (Receives assist 2-3x/week from home health for transport to/from appointments.)  ADL Assistance:  (Supervision for sink bath.  Toileting independently.   Needs occasional assist for dressing)  Homemaking Assistance:  (Pt is able to microwave her own meals.  [facility] does grocery shopping)  Ambulation Assistance: Needs assistance (Pt is primarily wc bound. Able to take several steps with grab bar support to get to toilet.  Does not use platform walker except for in  therapy.)  Transfer Assistance: Independent  Active Driver: No (working with 14/8/22 to return to driving)  Objective:    Tone  LUE Tone: Hypertonic  Tone Description: significant noted tone throughout L UE including shoulder, elbow, wrist, and hand/fingers  Activity Tolerance  Activity Tolerance: Patient tolerated evaluation without incident  Cognition  Overall Cognitive Status: WFL  LUE PROM (degrees)  LUE PROM: Exceptions  L Shoulder Flex  0-180: UTA  L Shoulder ABduction 0-180: UTA  L Elbow Flexion 0-145: WFL  L Elbow Extension 145-0: WFL  LUE AROM (degrees)  LUE AROM : Exceptions  LUE General AROM: seated position; unable to tolerate supine position  L Shoulder Flexion 0-180: 35  L Shoulder ABduction 0-180: 45  L Elbow Flexion 0-145: 40 AROM; up to 70degrees  L Forearm Pron 0-90: 0  L Forearm Supination  0-90: 0  L Wrist Flexion 0-80: 0  L Wrist Extension 0-70: 0  Left Hand AROM (degrees)  Left Hand AROM: Exceptions  Left Hand General AROM: able to complete grasp; unable to complete finger extension  RUE AROM (degrees)  RUE AROM : WFL  Right Hand AROM (degrees)  Right Hand AROM: WFL  LUE Strength  Gross LUE Strength: Exceptions to [facility]  L Shoulder Flex: 2-/5  L Shoulder Ext: 2-/5  L Shoulder ABduction: 2-/5  L Shoulder ADduction: 2-/5  L Elbow Flex: 2-/5  L Elbow Ext: 2-/5  L Hand General: 3+/5  LUE  Strength Comment: limited by significant tone  RUE Strength  Gross RUE Strength: WFL  Hand Dominance  Hand Dominance: Right  Left Hand Strength - Grips (lbs)  Handle Setting 2: UTA                                   Therapeutic Exercise:   Exercise/Equipment  Resistance/Repetitions   Other comments   Exercises   supine/ x5 reps    Shoulder:  Flex/ext  AB/AD    Elbow:   Flex/ext    Forearm:   Pronation/supination    Wrist:  Flex/ext    Hand: grasp/release    Stretching   supine/( unable to complete educated to aide or on self PROM due to pt abruptly ending session)  X5-10 reps; x30 second  hold    Shoulder:  Flex/ext  AB/AD    Elbow:   Flex/ext    Forearm:   Pronation/supination    Wrist:  Flex/ext    Hand: grasp/release Significantly limited due to noted tone throughout L UE          Home Exercise Program:    Unable to issue HEP handout (complete next session as able)    Manual Treatments:      Modalities:      Timed Code Treatment Minutes:  30 minutes     Total Treatment Minutes:  30 minutes (1345-1430)    Charges:  X2 Ther Ex    Treatment/Activity Tolerance:     []   Patient tolerated treatment well []   Patient limited by fatigue    []   Patient limited by pain []   Patient limited by other medical complications   [x]   Other: Poor insight    Assessment: Pt is a 71 y.o. F. beginning OP OT 12/7 s/p CVA with left hemiplegia, September 2021.  PMH includes L knee OA.  Prior Level of Functioning: Independent with ADL's, does light cooking, manages medications.  Has an aid that assist with driving to appointments, does laundry. Pt presents to OP OT with complaints of ongoing L sided deficits. Pt demonstrates significant limited L UE ROM including shoulder, elbow, wrist, and hand. Pt primarily limited due to significant noted tone throughout L UE. Pt reports no sensation deficits in L UE. Pt with no no noted cognition deficits. Pt is ~15 months out from CVA. Pt has completed OP OT with [facility] over last 5 months. Per chart review, pt with difficulty with transportation and poor insight into CVA/deficit outlook moving forward. Pt reports she is unable to medications for noted tone and has had one round of botox, although is no longer getting. Discussed with pt need to address future outlook including use of adapting activities and using adaptive equipment in order to promote independence. Pt very agitated and frustrated with current medical situation and continues to have poor understanding of medical/physical outlook and anticipate progress with L UE with being ~15  months from CVA. Pt reports able to have  MD appointment and plan to pursue botox in L UE although awaiting insurance approval. Discussed with pt need start HEP for daily stretching both by pt and assist of aide. Pt in agreement. Pt completed PROM/AAROM from therapist in supine. Upon sitting, pt reporting fatigue and light headedness. Pt reports having to leave to go home and nap immediately. Will continue to follow up with pt as able. Continue with POC.     Prognosis: []   Good [x]   Fair  [x]   Poor    Goals:      Patient Goals       Patient goals  L UE to work   Short Term Goals      Time Frame for Short Term Goals 4 weeks   Short Term Goal 1 Pt will report completing HEP Ind and report completing daily   STG 1 Current Status: --   STG Goal 1 Status: --   Short Term Goal 2 Pt will decrease QuickDASH score by 3 points for increased ability to complete ADLs/IADLs   STG 2 Current Status: --   STG Goal 2 Status: --   Short Term Goal 3 Pt will increase L shoulder flexion AROM 10 degrees for increased ability to assist with ADLs   STG 3 Current Status: --   STG Goal 3 Status: --   Short Term Goal 4 Pt will identify x3 activities she would like to become independent in, in order to address ongoing need for assist from aide   STG 4 Current Status: --   STG Goal 4 Status: --   Short Term Goal 5 Pt will demonstrate ability to complete all bathing and dressing Mod Ind with use of AD as needed   STG 5 Current Status: --   STG Goal 5 Status: --   Additional Goals?         Patient Requires Follow-up:  [x]   Yes  []   No    Plan: [x]   Continue per plan of care []   Alter current plan (see comments)   []   Plan of care initiated []   Hold pending MD visit []   Discharge    Plan for Next Session:      Electronically signed by:  , OT,OTR/L

## 2021-06-24 ENCOUNTER — Inpatient Hospital Stay: Admit: 2021-06-24 | Discharge: 2021-06-24 | Payer: MEDICARE | Primary: Physical Medicine & Rehabilitation

## 2021-06-24 NOTE — Progress Notes (Signed)
Physical Therapy: Daily Note   Patient: Rachel Sandoval (71 y.o. female)   Examination Date: 06/24/2021  Plan of Care/Certification Expiration Date: 08/11/21    Progress Note Counter: 3     DOB:  27-Nov-1950 # of Visits since [facility]: 2     MRN: [id number]  CSN: [id number] Start of Care Date: 06/16/2021     Insurance: Payor: AETNA MEDICARE / Plan: [employer] MEDICARE ADVANTAGE HMO / Product Type: Medicare /   Insurance ID: [id number] - (Medicare Managed) Secondary Insurance (if applicable):    Referring Physician: Jeral Pinch, MD Gena Fray, MD   PCP: Jeral Pinch, MD Visits to Date/Visits Approved: 2 /      No Show/Cancelled Appts:   /       Medical Diagnosis: Spastic hemiplegia affecting left nondominant side [G81.14] M62.81 (ICD-10-CM) - Muscle weakness (generalized)  I63.50 (ICD-10-CM) - Cerebral infarction due to unspecified occlusion or stenosis of unspecified cerebral artery  R26.81 (ICD-10-CM) - Unsteadiness on feet  R53.1 (ICD-10-CM) - Weakness  No data recorded      SUBJECTIVE EXAMINATION   Pain Level:  0/10    Patient Comments: Subjective: Pt frustrated regarding transportation, and her situation in general.  She is anxious to make progress in therapy.  She has been fitted for an AFO.    HEP Compliance: Poor        OBJECTIVE EXAMINATION   Restrictions:  Restrictions/Precautions: Fall Risk   No data recorded No data recorded Position Activity Restriction  Other position/activity restrictions: L hemiplegia      TREATMENT     Gait: (CPT L092365)  Treatment Reasoning    Gait Training 1: Ambulation on BWST x 35 s., single-hand support, limited d/t pain in L knee, and fear of falling.  Gait Training 2: Ambulation with platform walker x 60', slow speed, step-to pattern, c/o progressive fatigue, L knee pain, and low back pain, SBA, wc follow.  Gait Training 3: Ambulation x 80' in // bars, fast pace, improved posture, improved confidence, step-to pattern, no LOB, supervision.  Gait Training 4: Ambulation x  60' with LBQC, cues for cane positioning, SBA, step-to pattern, no LOB.  Pt reported improved ease of use compared to using hemi-walker.  Gait Training 5: Ambulation x 50' with SBQC, slow speed, c/o decreased stability relative to [facility], step-to pattern, no LOB. Limitations addressed: Balance, Activity tolerance, Coordination  Therapist provided: Assistance, Verbal cuing, Manual cuing, Tactile cuing  Progressed: Complexity of movement  Functional ability(s) targeted: Ambulating community distances   1:1 Time (minutes): 30               Pt Education: PT Education: PT Role, Goals, Family Education, Plan of Care, Evaluative findings, Equipment, Home Exercise Program, Precautions, Teacher, early years/pre, IT sales professional, Orientation, Investment banker, operational, Geneticist, molecular, Fall prevention strategies, Disease Specific Education, Injury Prevention       ASSESSMENT     Assessment: Assessment: Pt trialed multiple ambulation devices today, demonstrating fair stability with LBQC, reporting improved ease of use relative to hemi-walker.  Activity tolerance limited by L knee pain, low back pain, and general fatigue.  PT educated pt on the need of initiating a regular walking program (daily) at home and she verbalized understanding.  She has been fit for an AFO, which is expected to be ready in several weeks.  She would benefit from continued therapy to further improve her endurance, strength, balance, coordination, and independence transferring/ambulating.  Body Structures, Functions, Activity Limitations Requiring Skilled Therapeutic Intervention: Decreased  functional mobility , Decreased ROM, Decreased strength, Decreased ADL status, Decreased body mechanics, Decreased endurance, Decreased balance, Decreased fine motor control    Post-Treatment Pain Level:      Activity Tolerance: Patient tolerated treatment well    No data recorded     GOALS   Patient Goals : To regain use of her arm, and walk rather than use a wheelchair  Short  Term Goals Completed by 3-4 weeks Current Status Goal Status   Demonstrate independence with HEP to maximize L hip, knee, and ankle strength to facilitate improved balance and independence ambulating   New   Ambulate 150' with LRAD, SBA, to demonstrate improved endurance, and stability ambulating community distances   New   Ascend/Descend 4 steps with Min A to facilitate improved independence with household mobility   New                                                               Long Term Goals Completed by 6-8 weeks Current Status Goal Status   Complete TUG in 60 s. or less to facilitate improved speed and independence with household ambulation   New   Improve L hip and knee strength to grossly 4/5 to facilitate improved balance and independence with transfers and ambulation   New   Ambulate at least 250' with AFO LLE, walker or cane, (I), to facilitate improved independence with community mobility   New                                                                TREATMENT PLAN   Plan Frequency: 1-2x/week  Plan weeks: 6-8 weeks  Current Treatment Recommendations: Strengthening, ROM, Gait training, Balance training, Functional mobility training, Neuromuscular re-education, Transfer training, Stair training, Wheelchair mobility training, ADL/Self-care training, Cognitive/Perceptual training, Endurance training, Home exercise program, Safety education & training, Patient/Caregiver education & training, Equipment evaluation, education, & procurement, Therapeutic activities, Manual, Pain management, Positioning, Modalities   Specific Instructions for Next Treatment: HEP; AFO appointment; endurance and LLE strengthening       Therapy Time  Individual Time In:  1300     Individual Time Out: 1345  Minutes:  45  Timed Code Treatment Minutes: 30 Minutes  Gait training x 2 charges     Electronically signed by Renita Papa, PT [phone]  on 06/24/2021 at 2:12 PM   POC NOTE

## 2021-06-24 NOTE — Progress Notes (Signed)
Occupational Therapy      Occupational Therapy Daily Treatment Note    Date:  06/24/2021    Patient Name:  Rachel Sandoval     DOB:  1950/11/21  MRN: [id number]    Restrictions/Precautions:    Medical/Treatment Diagnosis Information:  Diagnosis: M62.81 (ICD-10-CM) - Muscle weakness (generalized)  I63.50 (ICD-10-CM) - Cerebral infarction due to unspecified occlusion or stenosis of unspecified cerebral artery  R26.81 (ICD-10-CM) - Unsteadiness on feet  R53.1 (ICD-10-CM) - Weakness  Treatment Diagnosis: decreased L UE ROM, strength, coordination, and ADLs/IADLs  Insurance/Certification information:  OT Insurance Information: [employer] MEDICARE  Physician Information:   Judyann Munson III, MD  Plan of care signed (Y/N):  N  Visit# / total visits:  3/8   Pain level: 0/10     G-Code (if applicable):      Date / Visit # G-Code Applied:  /   QuickDASH Total Score: 39 (05/13/21 1407)       QuickDASH Disability/Symptom Score : 63.64 % (05/13/21 1407)    Progress Note: []   Yes  [x]   No  Next due by: Visit #10      Subjective: Pt agreeable to OT treatment. Pt reports being irritated for various reasons including at her aide, at her ride (ex husband?), etc. Aide unable to attend for education on stretching this date.     Objective Measures:  Eval 05/14/21    Functional Status:  Functional Status  Occupation: Retired  Help From: Home health (Receives assist 2-3x/week from home health for transport to/from appointments.)  ADL Assistance:  (Supervision for sink bath.  Toileting independently.   Needs occasional assist for dressing)  Homemaking Assistance:  (Pt is able to microwave her own meals.  HHA does grocery shopping)  Ambulation Assistance: Needs assistance (Pt is primarily wc bound. Able to take several steps with grab bar support to get to toilet.  Does not use platform walker except for in therapy.)  Transfer Assistance: Independent  Active Driver: No (working with 14/8/22 to return to driving)  Objective:    Tone  LUE  Tone: Hypertonic  Tone Description: significant noted tone throughout L UE including shoulder, elbow, wrist, and hand/fingers  Activity Tolerance  Activity Tolerance: Patient tolerated evaluation without incident  Cognition  Overall Cognitive Status: WFL  LUE PROM (degrees)  LUE PROM: Exceptions  L Shoulder Flex  0-180: UTA  L Shoulder ABduction 0-180: UTA  L Elbow Flexion 0-145: WFL  L Elbow Extension 145-0: WFL  LUE AROM (degrees)  LUE AROM : Exceptions  LUE General AROM: seated position; unable to tolerate supine position  L Shoulder Flexion 0-180: 35  L Shoulder ABduction 0-180: 45  L Elbow Flexion 0-145: 40 AROM; up to 70degrees  L Forearm Pron 0-90: 0  L Forearm Supination  0-90: 0  L Wrist Flexion 0-80: 0  L Wrist Extension 0-70: 0  Left Hand AROM (degrees)  Left Hand AROM: Exceptions  Left Hand General AROM: able to complete grasp; unable to complete finger extension  RUE AROM (degrees)  RUE AROM : WFL  Right Hand AROM (degrees)  Right Hand AROM: WFL  LUE Strength  Gross LUE Strength: Exceptions to [facility]  L Shoulder Flex: 2-/5  L Shoulder Ext: 2-/5  L Shoulder ABduction: 2-/5  L Shoulder ADduction: 2-/5  L Elbow Flex: 2-/5  L Elbow Ext: 2-/5  L Hand General: 3+/5  LUE Strength Comment: limited by significant tone  RUE Strength  Gross RUE Strength: WFL  Hand Dominance  Hand Dominance: Right  Left Hand Strength - Grips (lbs)  Handle Setting 2: UTA                                   Therapeutic Exercise:   Exercise/Equipment  Resistance/Repetitions   Other comments   Exercises   supine/seated x10 reps; PROM/AAROM    Shoulder:  Flex/ext  AB/AD    Elbow:   Flex/ext    Forearm:   Pronation/supination    Wrist:  Flex/ext    Hand: grasp/release    Stretching   supine/X10 reps; x30 second hold    Shoulder:  Flex/ext  AB/AD    Elbow:   Flex/ext    Forearm:   Pronation/supination    Wrist:  Flex/ext    Hand: grasp/release Significantly limited due to noted tone throughout L UE   ADLs     Pt completes toileting with Min A  for managing pants on L posterior side; pt able to complete pericare; SBA/supervision for balance          Home Exercise Program:    Unable to issue HEP handout (complete next session as able)    Manual Treatments:      Modalities:      Timed Code Treatment Minutes:  40 minutes     Total Treatment Minutes:  45 minutes (1345-1430)    Charges:  X2 Ther Ex  X1 ADL    Treatment/Activity Tolerance:     []   Patient tolerated treatment well []   Patient limited by fatigue    []   Patient limited by pain []   Patient limited by other medical complications   [x]   Other: Poor insight    Assessment: Pt is a 71 y.o. F. beginning OP OT 12/7 s/p CVA with left hemiplegia, September 2021.  PMH includes L knee OA.  Prior Level of Functioning: Independent with ADL's, does light cooking, manages medications.  Has an aid that assist with driving to appointments, does laundry. Pt presents to OP OT with complaints of ongoing L sided deficits. Pt demonstrates significant limited L UE ROM including shoulder, elbow, wrist, and hand. Pt primarily limited due to significant noted tone throughout L UE. Pt reports no sensation deficits in L UE. Pt with no no noted cognition deficits. Pt is ~15 months out from CVA. Pt has completed OP OT with [facility] over last 5 months. Per chart review, pt with difficulty with transportation and poor insight into CVA/deficit outlook moving forward. Pt reports she is unable to medications for noted tone and has had one round of botox, although is no longer getting. Discussed with pt need to address future outlook including use of adapting activities and using adaptive equipment in order to promote independence. Pt very agitated and frustrated with current medical situation and continues to have poor understanding of medical/physical outlook and anticipate progress with L UE with being ~15  months from CVA. Pt reports able to have MD appointment and plan to pursue botox in L UE although awaiting insurance  approval. Pt continues to be limited due to significant noted tone in L UE. Pt reports mild pain in L UE during stretching. Pt with increased noted AROM in L UE when pt is not distracted on other topic and focused on task at hand. Pt continues to be frustrated with ongoing problems including her assist at home, botox, MD appointments etc. Pt reports she does not want any  assist from others for HEP. Continue with POC.     Prognosis: []   Good [x]   Fair  [x]   Poor    Goals:      Patient Goals       Patient goals  L UE to work   Short Term Goals      Time Frame for Short Term Goals 4 weeks   Short Term Goal 1 Pt will report completing HEP Ind and report completing daily   STG 1 Current Status: --   STG Goal 1 Status: --   Short Term Goal 2 Pt will decrease QuickDASH score by 3 points for increased ability to complete ADLs/IADLs   STG 2 Current Status: --   STG Goal 2 Status: --   Short Term Goal 3 Pt will increase L shoulder flexion AROM 10 degrees for increased ability to assist with ADLs   STG 3 Current Status: --   STG Goal 3 Status: --   Short Term Goal 4 Pt will identify x3 activities she would like to become independent in, in order to address ongoing need for assist from aide   STG 4 Current Status: --   STG Goal 4 Status: --   Short Term Goal 5 Pt will demonstrate ability to complete all bathing and dressing Mod Ind with use of AD as needed   STG 5 Current Status: --   STG Goal 5 Status: --   Additional Goals?         Patient Requires Follow-up:  [x]   Yes  []   No    Plan: [x]   Continue per plan of care []   Alter current plan (see comments)   []   Plan of care initiated []   Hold pending MD visit []   Discharge    Plan for Next Session:      Electronically signed by:  Cornelia CopaMitchell Magdaline Zollars, OT,OTR/L

## 2021-06-30 DIAGNOSIS — H25813 Combined forms of age-related cataract, bilateral: Secondary | ICD-10-CM | POA: Diagnosis not present

## 2021-07-01 ENCOUNTER — Encounter: Payer: MEDICARE | Primary: Physical Medicine & Rehabilitation

## 2021-07-08 ENCOUNTER — Encounter: Payer: MEDICARE | Primary: Physical Medicine & Rehabilitation

## 2021-07-08 DIAGNOSIS — I69354 Hemiplegia and hemiparesis following cerebral infarction affecting left non-dominant side: Secondary | ICD-10-CM

## 2021-07-08 NOTE — Progress Notes (Signed)
Physical Therapy  Cancellation/No-show Note  Patient Name:  Rachel Sandoval  DOB:  [birth year]   Date:  07/08/2021  Cancelled visits to date: 1  No-shows to date: 0    For today's appointment patient:  [x]   Cancelled  []   Rescheduled appointment  []   No-show     Reason given by patient:  []   Patient ill  []   Conflicting appointment  []   No transportation    []   Conflict with work  []   No reason given  [x]   Other:     Comments:  Pt reports not having good day and not going to be able to attend PT treatment session. Pt reports also unable to attend next week's session 07/15/20 due to appointment for brace.    Electronically signed by:  , PT  Electronically signed by , PT [phone] on 07/08/2021 at 1:34 PM

## 2021-07-08 NOTE — Progress Notes (Signed)
Occupational Therapy      Occupational Therapy  Cancellation/No-show Note  Patient Name:  Rachel Sandoval   DOB:  25-Sep-1950   Date:  07/08/2021  Cancelled visits to date: 1  No-shows to date: 0    For today's appointment patient:  [x]     Cancelled  []     Rescheduled appointment  []     No-show     Reason given by patient:  []     Patient ill  []     Conflicting appointment  []     No transportation    []     Conflict with work  []     No reason given  []     Other:     Comments:  Pt reports not having good day and not going to be able to attend OT treatment session. Pt reports also unable to attend next week's session 07/15/20 due to appointment for brace.     Electronically signed by:  , OT, OTR/L

## 2021-07-09 ENCOUNTER — Inpatient Hospital Stay: Payer: MEDICARE | Primary: Physical Medicine & Rehabilitation

## 2021-07-22 ENCOUNTER — Inpatient Hospital Stay: Admit: 2021-07-22 | Discharge: 2021-07-23 | Payer: MEDICARE | Primary: Physical Medicine & Rehabilitation

## 2021-07-22 ENCOUNTER — Inpatient Hospital Stay: Admit: 2021-07-22 | Discharge: 2021-07-22 | Payer: MEDICARE | Primary: Physical Medicine & Rehabilitation

## 2021-07-22 NOTE — Progress Notes (Signed)
Occupational Therapy      Outpatient Occupational Therapy  []  [facility]   Phone: [phone]   Fax: [phone]   [x]  [facility]           Phone: [phone]   Fax: [phone]  []  [facility]   Phone: [phone]   Fax: [phone]     Occupational Therapy Discharge Note  Date: 07/23/2021        Patient Name:  Rachel Sandoval    DOB:  09-Feb-1951  MRN: [id number]    Restrictions/Precautions:    Medical/Treatment Diagnosis Information:  Diagnosis: M62.81 (ICD-10-CM) - Muscle weakness (generalized)  I63.50 (ICD-10-CM) - Cerebral infarction due to unspecified occlusion or stenosis of unspecified cerebral artery  R26.81 (ICD-10-CM) - Unsteadiness on feet  R53.1 (ICD-10-CM) - Weakness  Treatment Diagnosis: decreased L UE ROM, strength, coordination, and ADLs/IADLs  Insurance/Certification information:  OT Insurance Information: [place] MEDICARE  Physician Information:   Melina Modena III, MD  Plan of care signed (Y/N):  N  Visit# / total visits:  4/8   Pain level:      0/10          G-Code (if applicable):                                             Date / Visit # G-Code Applied:  /   QuickDASH Total Score: 39 (07/23/21)       QuickDASH Disability/Symptom Score : 63.64 % (07/23/21)    Time Period for Report:  05/14/21-07/22/21  Cancels/No-shows to date:  1 cancel, 2 sessions left during session    Plan of Care/Treatment to date: D/C from OT  []  Therapeutic Exercise    []  Modalities:  []  Therapeutic Activity     []  Ultrasound  []  Electrical Stimulation  []  Total Motion Release     []  Fluidotherapy []  Kinisiotaping  []  Neuromuscular Re-education  []  Cold/hotpack []  Iontophoresis  []  Instruction in HEP     Other:  []  Manual Therapy      []             []  Aquatic Therapy      []                          Significant Findings At Last Visit/Comments:    Subjective:    Pt reports not completing HEP over last few weeks. Pt with multiple complaints including her MD moving his practice. Pt argumentative throughout stating she's  "not leaving until I get an appointment with my doctor". Therapists manager completed steps and made calls on pt's behalf to assist with setting up appointments. Pt continued to be agitated and left midway through session (for 2nd time). Pt was made aware that she will not be scheduled again until/if she lines up botox due to inability to make/participate in therapy sessions and inability/choosing not to complete HEP.      Objective:  Eval 05/14/21     Functional Status:  Functional Status  Occupation: Retired  Marine scientist Help From: Home health (Receives assist 2-3x/week from home health for transport to/from appointments.)  ADL Assistance:  (Supervision for sink bath.  Toileting independently.   Needs occasional assist for dressing)  Homemaking Assistance:  (Pt is able to microwave her own meals.  HHA does grocery shopping)  Ambulation Assistance: Needs assistance (Pt is primarily wc bound.  Able to take several steps with grab bar support to get to toilet.  Does not use platform walker except for in therapy.)  Transfer Assistance: Independent  Active Driver: No (working with Corene Cornea to return to driving)  Objective:    Tone  LUE Tone: Hypertonic  Tone Description: significant noted tone throughout L UE including shoulder, elbow, wrist, and hand/fingers  Activity Tolerance  Activity Tolerance: Patient tolerated evaluation without incident  Cognition  Overall Cognitive Status: WFL  LUE PROM (degrees)  LUE PROM: Exceptions  L Shoulder Flex  0-180: UTA  L Shoulder ABduction 0-180: UTA  L Elbow Flexion 0-145: WFL  L Elbow Extension 145-0: WFL  LUE AROM (degrees)  LUE AROM : Exceptions  LUE General AROM: seated position; unable to tolerate supine position  L Shoulder Flexion 0-180: 35  L Shoulder ABduction 0-180: 45  L Elbow Flexion 0-145: 40 AROM; up to 70degrees  L Forearm Pron 0-90: 0  L Forearm Supination  0-90: 0  L Wrist Flexion 0-80: 0  L Wrist Extension 0-70: 0  Left Hand AROM (degrees)  Left Hand AROM:  Exceptions  Left Hand General AROM: able to complete grasp; unable to complete finger extension  RUE AROM (degrees)  RUE AROM : WFL  Right Hand AROM (degrees)  Right Hand AROM: WFL  LUE Strength  Gross LUE Strength: Exceptions to [facility]  L Shoulder Flex: 2-/5  L Shoulder Ext: 2-/5  L Shoulder ABduction: 2-/5  L Shoulder ADduction: 2-/5  L Elbow Flex: 2-/5  L Elbow Ext: 2-/5  L Hand General: 3+/5  LUE Strength Comment: limited by significant tone  RUE Strength  Gross RUE Strength: WFL  Hand Dominance  Hand Dominance: Right  Left Hand Strength - Grips (lbs)  Handle Setting 2: UTA    07/22/21     Functional Status:  Functional Status  Occupation: Retired  Marine scientist Help From: Home health ([place] assist 2-3x/week from home health for transport to/from appointments.)  ADL Assistance:  (Supervision for sink bath.  Toileting independently.   Needs occasional assist for dressing)  Homemaking Assistance:  (Pt is able to microwave her own meals.  HHA does grocery shopping)  Ambulation Assistance: Needs assistance (Pt is primarily wc bound. Able to take several steps with grab bar support to get to toilet.  Does not use platform walker except for in therapy.)  Transfer Assistance: Independent  Active Driver: No (working with Corene Cornea to return to driving)  Objective:    Tone  LUE Tone: Hypertonic  Tone Description: significant noted tone throughout L UE including shoulder, elbow, wrist, and hand/fingers  Activity Tolerance  Activity Tolerance: Patient tolerated evaluation without incident  Cognition  Overall Cognitive Status: WFL  LUE PROM (degrees)  LUE PROM: Exceptions  L Shoulder Flex  0-180: UTA  L Shoulder ABduction 0-180: UTA  L Elbow Flexion 0-145: WFL  L Elbow Extension 145-0: WFL  LUE AROM (degrees)  LUE AROM : Exceptions  LUE General AROM: seated position; unable to tolerate supine position  L Shoulder Flexion 0-180: 35  L Shoulder ABduction 0-180: 35  L Elbow Flexion 0-145: 30 AROM; up to 70 degrees  L Forearm Pron 0-90:  0  L Forearm Supination  0-90: 0  L Wrist Flexion 0-80: 0  L Wrist Extension 0-70: 0  Left Hand AROM (degrees)  Left Hand AROM: Exceptions  Left Hand General AROM: able to complete grasp; unable to complete finger extension  RUE AROM (degrees)  RUE AROM : WFL  Right  Hand AROM (degrees)  Right Hand AROM: WFL  LUE Strength  Gross LUE Strength: Exceptions to [facility]  L Shoulder Flex: 2-/5  L Shoulder Ext: 2-/5  L Shoulder ABduction: 2-/5  L Shoulder ADduction: 2-/5  L Elbow Flex: 2-/5  L Elbow Ext: 2-/5  L Hand General: 3+/5  LUE Strength Comment: limited by significant tone  RUE Strength  Gross RUE Strength: WFL  Hand Dominance  Hand Dominance: Right  Left Hand Strength - Grips (lbs)  Handle Setting 2: UTA     Unable to complete any additional measurements at this time due to pt choosing to leave therapy session.         Assessment:  Pt is a 71 y.o. F. beginning OP OT 12/7 s/p CVA with left hemiplegia, September 2021.  PMH includes L knee OA.  Prior Level of Functioning: Independent with ADL's, does light cooking, manages medications.  Has an aid that assist with driving to appointments, does laundry. Pt presents to OP OT with complaints of ongoing L sided deficits. Pt demonstrates significant limited L UE ROM including shoulder, elbow, wrist, and hand. Pt primarily limited due to significant noted tone throughout L UE. Pt reports no sensation deficits in L UE. Pt with no no noted cognition deficits. Pt is ~15 months out from CVA. Pt has completed OP OT with [facility] over last 5 months. Per chart review, pt with difficulty with transportation and poor insight into CVA/deficit outlook moving forward. Pt reports she is unable to medications for noted tone and has had one round of botox, although is no longer getting. Discussed with pt need to address future outlook including use of adapting activities and using adaptive equipment in order to promote independence. Pt very agitated and frustrated with current medical  situation and continues to have poor understanding of medical/physical outlook and anticipate progress with L UE with being ~15  months from CVA. Pt has had poor progress at this time due to pt's self limiting behaviors, poor ability to attend/participate in sessions, and poor insight into medical situation. Pt has reached 0/5 goals. Pt continues to be significantly limited due to increased tone and minimal AROM in L AROM. Pt reports not completing or attempting HEP at home for multiple reasons. Pt with limited assist at home although limits self to who is allowed to assist. It appears pt's biggest obstacle at this time is finding someone to assist with managing medical appointments. Pt initially reported plan to seek botox to assist with L UE although now states, as pt was leaving, that she doesn't know if she'll pursue it due to not working the last time (per pt, pt had received x1 round). Despite ongoing attempted education, pt continues to have poor therapy outlook at this time. Pt was told she will not be scheduled for any ongoing OT at this location unless she pursues botox, due to poor progression, outlook, and ability for pt to participate.      Progress towards goals:    Patient Goals       Patient goals  L UE to work   Short Term Goals      Time Frame for Short Term Goals 4 weeks   Short Term Goal 1 Pt will report completing HEP Ind and report completing daily- goal not met 2/16   STG 1 Current Status: --   STG Goal 1 Status: --   Short Term Goal 2 Pt will decrease QuickDASH score by 3 points for increased ability  to complete ADLs/IADLs- goal not met 2/16   STG 2 Current Status: --   STG Goal 2 Status: --   Short Term Goal 3 Pt will increase L shoulder flexion AROM 10 degrees for increased ability to assist with ADLs- goal not met 2/16   STG 3 Current Status: --   STG Goal 3 Status: --   Short Term Goal 4 Pt will identify x3 activities she would like to become independent in, in order to address ongoing  need for assist from aide- goal not met 2/16   STG 4 Current Status: --   STG Goal 4 Status: --   Short Term Goal 5 Pt will demonstrate ability to complete all bathing and dressing Mod Ind with use of AD as needed- goal not met 2/16   STG 5 Current Status: --   STG Goal 5 Status: --   Additional Goals?        Current Frequency/Duration: D/C from OT  # Days per week: []  1 day # Weeks: []  1 week []  4 weeks      []  2 days   []  2 weeks []  5 weeks      []  3 days   []  3 weeks []  6 weeks     Rehab Potential: []  Excellent []  Good []  Fair  [x]  Poor     Goal Status:  []  Achieved []  Partially Achieved  [x]  Not Achieved     Patient Status: []  Continue per initial plan of Care     [x]  Patient now discharged     []  Additional visits requested, Please re-certify for additional visits:      Requested frequency/duration:  X/week for weeks    Electronically signed by:  Woodfin Ganja, OT, OTR/L    If you have any questions or concerns, please don't hesitate to call.  Thank you for your referral.    Physician Signature:________________________________Date:__________________  By signing above, therapist???s plan is approved by physician

## 2021-07-22 NOTE — Progress Notes (Signed)
Occupational Therapy      Occupational Therapy Daily Treatment Note    Date:  07/22/2021    Patient Name:  Rachel Sandoval     DOB:  1950-11-12  MRN: [id number]    Restrictions/Precautions:    Medical/Treatment Diagnosis Information:  Diagnosis: M62.81 (ICD-10-CM) - Muscle weakness (generalized)  I63.50 (ICD-10-CM) - Cerebral infarction due to unspecified occlusion or stenosis of unspecified cerebral artery  R26.81 (ICD-10-CM) - Unsteadiness on feet  R53.1 (ICD-10-CM) - Weakness  Treatment Diagnosis: decreased L UE ROM, strength, coordination, and ADLs/IADLs  Insurance/Certification information:  OT Insurance Information: [place] MEDICARE  Physician Information:   Melina Modena III, MD  Plan of care signed (Y/N):  N  Visit# / total visits:  4/8   Pain level: 0/10     G-Code (if applicable):      Date / Visit # G-Code Applied:  /   QuickDASH Total Score: 39 (07/23/21)       QuickDASH Disability/Symptom Score : 63.64 % (07/23/21)    Progress Note: [x]   Yes  []   No  Next due by: Visit #10      Subjective: Pt reports not completing HEP over last few weeks. Pt with multiple complaints including her MD moving his practice. Pt argumentative throughout stating she's "not leaving until I get an appointment with my doctor". Therapists manager completed steps and made calls on pt's behalf to assist with setting up appointments. Pt continued to be agitated and left midway through session (for 2nd time). Pt was made aware that she will not be scheduled again until/if she lines up botox due to inability to make/participate in therapy sessions and inability/choosing not to complete HEP.     Objective Measures:  Eval 05/14/21    Functional Status:  Functional Status  Occupation: Retired  Marine scientist Help From: Home health (Receives assist 2-3x/week from home health for transport to/from appointments.)  ADL Assistance:  (Supervision for sink bath.  Toileting independently.   Needs occasional assist for dressing)  Homemaking Assistance:   (Pt is able to microwave her own meals.  HHA does grocery shopping)  Ambulation Assistance: Needs assistance (Pt is primarily wc bound. Able to take several steps with grab bar support to get to toilet.  Does not use platform walker except for in therapy.)  Transfer Assistance: Independent  Active Driver: No (working with Corene Cornea to return to driving)  Objective:    Tone  LUE Tone: Hypertonic  Tone Description: significant noted tone throughout L UE including shoulder, elbow, wrist, and hand/fingers  Activity Tolerance  Activity Tolerance: Patient tolerated evaluation without incident  Cognition  Overall Cognitive Status: WFL  LUE PROM (degrees)  LUE PROM: Exceptions  L Shoulder Flex  0-180: UTA  L Shoulder ABduction 0-180: UTA  L Elbow Flexion 0-145: WFL  L Elbow Extension 145-0: WFL  LUE AROM (degrees)  LUE AROM : Exceptions  LUE General AROM: seated position; unable to tolerate supine position  L Shoulder Flexion 0-180: 35  L Shoulder ABduction 0-180: 45  L Elbow Flexion 0-145: 40 AROM; up to 70degrees  L Forearm Pron 0-90: 0  L Forearm Supination  0-90: 0  L Wrist Flexion 0-80: 0  L Wrist Extension 0-70: 0  Left Hand AROM (degrees)  Left Hand AROM: Exceptions  Left Hand General AROM: able to complete grasp; unable to complete finger extension  RUE AROM (degrees)  RUE AROM : WFL  Right Hand AROM (degrees)  Right Hand AROM: WFL  LUE Strength  Gross  LUE Strength: Exceptions to [facility]  L Shoulder Flex: 2-/5  L Shoulder Ext: 2-/5  L Shoulder ABduction: 2-/5  L Shoulder ADduction: 2-/5  L Elbow Flex: 2-/5  L Elbow Ext: 2-/5  L Hand General: 3+/5  LUE Strength Comment: limited by significant tone  RUE Strength  Gross RUE Strength: WFL  Hand Dominance  Hand Dominance: Right  Left Hand Strength - Grips (lbs)  Handle Setting 2: UTA   07/22/21    Functional Status:  Functional Status  Occupation: Retired  Marine scientist Help From: Home health (Receives assist 2-3x/week from home health for transport to/from appointments.)  ADL  Assistance:  (Supervision for sink bath.  Toileting independently.   Needs occasional assist for dressing)  Homemaking Assistance:  (Pt is able to microwave her own meals.  HHA does grocery shopping)  Ambulation Assistance: Needs assistance (Pt is primarily wc bound. Able to take several steps with grab bar support to get to toilet.  Does not use platform walker except for in therapy.)  Transfer Assistance: Independent  Active Driver: No (working with Corene Cornea to return to driving)  Objective:    Tone  LUE Tone: Hypertonic  Tone Description: significant noted tone throughout L UE including shoulder, elbow, wrist, and hand/fingers  Activity Tolerance  Activity Tolerance: Patient tolerated evaluation without incident  Cognition  Overall Cognitive Status: WFL  LUE PROM (degrees)  LUE PROM: Exceptions  L Shoulder Flex  0-180: UTA  L Shoulder ABduction 0-180: UTA  L Elbow Flexion 0-145: WFL  L Elbow Extension 145-0: WFL  LUE AROM (degrees)  LUE AROM : Exceptions  LUE General AROM: seated position; unable to tolerate supine position  L Shoulder Flexion 0-180: 35  L Shoulder ABduction 0-180: 35  L Elbow Flexion 0-145: 30 AROM; up to 70 degrees  L Forearm Pron 0-90: 0  L Forearm Supination  0-90: 0  L Wrist Flexion 0-80: 0  L Wrist Extension 0-70: 0  Left Hand AROM (degrees)  Left Hand AROM: Exceptions  Left Hand General AROM: able to complete grasp; unable to complete finger extension  RUE AROM (degrees)  RUE AROM : WFL  Right Hand AROM (degrees)  Right Hand AROM: WFL  LUE Strength  Gross LUE Strength: Exceptions to [facility]  L Shoulder Flex: 2-/5  L Shoulder Ext: 2-/5  L Shoulder ABduction: 2-/5  L Shoulder ADduction: 2-/5  L Elbow Flex: 2-/5  L Elbow Ext: 2-/5  L Hand General: 3+/5  LUE Strength Comment: limited by significant tone  RUE Strength  Gross RUE Strength: WFL  Hand Dominance  Hand Dominance: Right  Left Hand Strength - Grips (lbs)  Handle Setting 2: UTA    Unable to complete any additional measurements at this time  due to pt choosing to leave therapy session.                                 Therapeutic Exercise:   Exercise/Equipment  Resistance/Repetitions   Other comments   Exercises      Stretching   supine/X5 reps; x30 second hold    Shoulder:  Flex/ext  AB/AD    Elbow:   Flex/ext    Forearm:   Pronation/supination    Significantly limited due to noted tone throughout L UE        Home Exercise Program:    Unable to issue HEP handout (complete next session as able)    Manual Treatments:  Modalities:      Timed Code Treatment Minutes:  25 minutes     Total Treatment Minutes:  35 minutes (1345-1420)    Charges:  X2 Ther Ex    Treatment/Activity Tolerance:     []   Patient tolerated treatment well []   Patient limited by fatigue    []   Patient limited by pain []   Patient limited by other medical complications   [x]   Other: Poor insight    Assessment: Pt is a 71 y.o. F. beginning OP OT 12/7 s/p CVA with left hemiplegia, September 2021.  PMH includes L knee OA.  Prior Level of Functioning: Independent with ADL's, does light cooking, manages medications.  Has an aid that assist with driving to appointments, does laundry. Pt presents to OP OT with complaints of ongoing L sided deficits. Pt demonstrates significant limited L UE ROM including shoulder, elbow, wrist, and hand. Pt primarily limited due to significant noted tone throughout L UE. Pt reports no sensation deficits in L UE. Pt with no no noted cognition deficits. Pt is ~15 months out from CVA. Pt has completed OP OT with [facility] over last 5 months. Per chart review, pt with difficulty with transportation and poor insight into CVA/deficit outlook moving forward. Pt reports she is unable to medications for noted tone and has had one round of botox, although is no longer getting. Discussed with pt need to address future outlook including use of adapting activities and using adaptive equipment in order to promote independence. Pt very agitated and frustrated with current  medical situation and continues to have poor understanding of medical/physical outlook and anticipate progress with L UE with being ~15  months from CVA. Pt has had poor progress at this time due to pt's self limiting behaviors, poor ability to attend/participate in sessions, and poor insight into medical situation. Pt has reached 0/5 goals. Pt continues to be significantly limited due to increased tone and minimal AROM in L AROM. Pt reports not completing or attempting HEP at home for multiple reasons. Pt with limited assist at home although limits self to who is allowed to assist. It appears pt's biggest obstacle at this time is finding someone to assist with managing medical appointments. Pt initially reported plan to seek botox to assist with L UE although now states, as pt was leaving, that she doesn't know if she'll pursue it due to not working the last time (per pt, pt had received x1 round). Despite ongoing attempted education, pt continues to have poor therapy outlook at this time. Pt was told she will not be scheduled for any ongoing OT at this location unless she pursues botox, due to poor progression, outlook, and ability for pt to participate.      Prognosis: []   Good []   Fair  [x]   Poor    Goals:      Patient Goals       Patient goals  L UE to work   Short Term Goals      Time Frame for Short Term Goals 4 weeks   Short Term Goal 1 Pt will report completing HEP Ind and report completing daily- goal not met 2/16   STG 1 Current Status: --   STG Goal 1 Status: --   Short Term Goal 2 Pt will decrease QuickDASH score by 3 points for increased ability to complete ADLs/IADLs- goal not met 2/16   STG 2 Current Status: --   STG Goal 2 Status: --   Short  Term Goal 3 Pt will increase L shoulder flexion AROM 10 degrees for increased ability to assist with ADLs- goal not met 2/16   STG 3 Current Status: --   STG Goal 3 Status: --   Short Term Goal 4 Pt will identify x3 activities she would like to become  independent in, in order to address ongoing need for assist from aide- goal not met 2/16   STG 4 Current Status: --   STG Goal 4 Status: --   Short Term Goal 5 Pt will demonstrate ability to complete all bathing and dressing Mod Ind with use of AD as needed- goal not met 2/16   STG 5 Current Status: --   STG Goal 5 Status: --   Additional Goals?         Patient Requires Follow-up:  []   Yes  [x]   No    Plan: []   Continue per plan of care []   Alter current plan (see comments)   []   Plan of care initiated []   Hold pending MD visit [x]   Discharge    Plan for Next Session:      Electronically signed by:  Woodfin Ganja, OT,OTR/L

## 2021-07-22 NOTE — Progress Notes (Signed)
Physical Therapy: Daily Note   Patient: Rachel Sandoval (71 y.o. female)   Examination Date: 07/22/2021  Plan of Care/Certification Expiration Date: 08/11/21    Progress Note Counter: 4     DOB:  19-Sep-1950 # of Visits since [facility]: 3     MRN: [id number]  CSN: [id number] Start of Care Date: 06/16/2021     Insurance: Payor: [employer] MEDICARE / Plan: [employer] MEDICARE ADVANTAGE HMO / Product Type: Medicare /   Insurance ID: [id number] - (Medicare Managed) Secondary Insurance (if applicable):    Referring Physician: Jeral Pinch, MD Gena Fray, MD   PCP: Jeral Pinch, MD Visits to Date/Visits Approved: 3 /      No Show/Cancelled Appts:   /       Medical Diagnosis: Spastic hemiplegia affecting left nondominant side [G81.14] M62.81 (ICD-10-CM) - Muscle weakness (generalized)  I63.50 (ICD-10-CM) - Cerebral infarction due to unspecified occlusion or stenosis of unspecified cerebral artery  R26.81 (ICD-10-CM) - Unsteadiness on feet  R53.1 (ICD-10-CM) - Weakness  No data recorded      SUBJECTIVE EXAMINATION   Pain Level:  2/10 L knee    Patient Comments: Subjective: Pt received AFO.  She is pleased with it.  She reports she has not received botox injections d/t her OP rehab practice closing.  She is very upset about this.    HEP Compliance: Poor        OBJECTIVE EXAMINATION   Restrictions:  Restrictions/Precautions: Fall Risk   No data recorded No data recorded Position Activity Restriction  Other position/activity restrictions: L hemiplegia      TREATMENT     Exercises:  Therapeutic exercise (CPT 97110)   Treatment Reasoning    Exercise 1: Standing with // bar support: lateral weight shift x 3 min., A/P weight shift x 1 min.  Exercise 2: PT educated pt on importance of initiating a regular walking program at home to improve endurance, and maximize confidence using cane for mobility at home and in community.  Pt is hesitant to do this because she is afraid that if she exercises too much, she will not have the  energy needed to complete ADLs.  PT explained to pt that it may not be realistic to expect significant gains in her walking if she does not practice it regularly.  She states that she plans to practice ambulating when she is here for therapy.  PT noted that pt cannot tolerate a full session of ambulation d/t L knee pain, and limited activity tolerance.  PT educated pt on benefits of supine stretching exercises, and strengthening exercises to maximize pt's LE strength.  Pt is not interested in LE strength/stretching exercises in the low mat because she states she has already done these exercises, and did not benefit from them. Time spent educating pt = 10 + min.    Limitations addressed: Mobility, Strength, Coordination, Balance, Activity tolerance  Therapist provided: Assistance, Verbal cuing, Manual cuing   1:1 Time (minutes): 15                 Gait: (CPT L092365)  Treatment Reasoning    Gait Training 4: Ambulation x 60' with LBQC, LLE AFO, cues for cane positioning, SBA, step-to pattern, no LOB.  Pt limited d/t knee pain.  Gait Training 5: Ambulation x 50', 20' with SBQC, LLE AFO, slow speed, wc follow, no LOB, limited d/t knee pain. Limitations addressed: Balance, Activity tolerance, Coordination  Therapist provided: Assistance, Verbal cuing, Manual cuing, Tactile cuing  Progressed: Complexity of movement  Functional ability(s) targeted: Ambulating community distances   1:1 Time (minutes): 10               Pt Education: PT Education: PT Role, Goals, Family Education, Plan of Care, Evaluative findings, Equipment, Home Exercise Program, Precautions, Teacher, early years/pre, IT sales professional, Orientation, Investment banker, operational, Geneticist, molecular, Fall prevention strategies, Disease Specific Education, Injury Prevention       ASSESSMENT     Assessment: Assessment: Pt received LLE AFO, and demonstrated enough stability to ambulate with SBQC, and LBQC, SBA.  Activity tolerance was limited by L knee pain.  After several  ambulation trials, and a round of standing exercise, pt was unable to continue participating in formal exercises.  She becomes easily frustrated d/t social and physical barriers which limit her long-term recovery.  She would benefit from continued therapy to maximize her strength, balance, endurance, and independence ambulating, though it is unclear if she will choose to keep coming to therapy after this session.  Body Structures, Functions, Activity Limitations Requiring Skilled Therapeutic Intervention: Decreased functional mobility , Decreased ROM, Decreased strength, Decreased ADL status, Decreased body mechanics, Decreased endurance, Decreased balance, Decreased fine motor control    Post-Treatment Pain Level:      Activity Tolerance: Patient tolerated treatment well    No data recorded     GOALS   Patient Goals : To regain use of her arm, and walk rather than use a wheelchair  Short Term Goals Completed by 3-4 weeks Current Status Goal Status   Demonstrate independence with HEP to maximize L hip, knee, and ankle strength to facilitate improved balance and independence ambulating       Ambulate 150' with LRAD, SBA, to demonstrate improved endurance, and stability ambulating community distances   New   Ascend/Descend 4 steps with Min A to facilitate improved independence with household mobility   New                                                               Long Term Goals Completed by 6-8 weeks Current Status Goal Status   Complete TUG in 60 s. or less to facilitate improved speed and independence with household ambulation   New   Improve L hip and knee strength to grossly 4/5 to facilitate improved balance and independence with transfers and ambulation   New   Ambulate at least 250' with AFO LLE, walker or cane, (I), to facilitate improved independence with community mobility   New                                                                TREATMENT PLAN   Plan Frequency: 1-2x/week  Plan weeks: 6-8  weeks  Current Treatment Recommendations: Strengthening, ROM, Gait training, Balance training, Functional mobility training, Neuromuscular re-education, Transfer training, Stair training, Wheelchair mobility training, ADL/Self-care training, Cognitive/Perceptual training, Endurance training, Home exercise program, Safety education & training, Patient/Caregiver education & training, Equipment evaluation, education, & procurement, Therapeutic activities, Manual, Pain management, Positioning, Modalities   Specific Instructions for Next Treatment:  HEP; ambulation; endurance and LLE strengthening       Therapy Time  Individual Time In: 1300       Individual Time Out: 1345  Minutes: 45  Timed Code Treatment Minutes: 25 Minutes   Therapeutic Exercise x 1 charge  Gait training x 1 charge  Electronically signed by Renita Papa, PT [phone] on 07/22/2021 at 4:29 PM   POC NOTE

## 2021-07-29 ENCOUNTER — Inpatient Hospital Stay: Admit: 2021-07-29 | Discharge: 2021-07-29 | Payer: MEDICARE | Primary: Physical Medicine & Rehabilitation

## 2021-07-29 ENCOUNTER — Encounter: Payer: MEDICARE | Primary: Physical Medicine & Rehabilitation

## 2021-07-29 NOTE — Plan of Care (Signed)
[facility]  [facility] [id number]  Dept: [phone]  Loc: [phone]    PHYSICAL THERAPY PLAN OF CARE: DISCHARGE    Patient: Rachel Sandoval (71 y.o. female)   Examination Date: 30/16/0109  Plan of Care Certification Period: 07/29/2021 to 08/11/21  Progress Note Counter: 5   DOB:  1950-08-09  MRN: [id number]  CSN: [id number]   Insurance: Payor: [employer] MEDICARE / Plan: [place] HMO / Product Type: Medicare /   Insurance ID: [id number] - (Medicare Managed) Secondary Insurance (if applicable):    Referring Physician: Elnora Morrison, MD Melina Modena III, MD   PCP: Elnora Morrison, MD Visits to Date/Visits Approved: 4 /      No Show/Cancelled Appts:   /       Medical Diagnosis: Spastic hemiplegia affecting left nondominant side [G81.14] M62.81 (ICD-10-CM) - Muscle weakness (generalized)  I63.50 (ICD-10-CM) - Cerebral infarction due to unspecified occlusion or stenosis of unspecified cerebral artery  R26.81 (ICD-10-CM) - Unsteadiness on feet  R53.1 (ICD-10-CM) - Weakness  No data recorded     ASSESSMENT     Assessment: Assessment: Pt requested to be discharged today.  She has not met any of her short-term goals, but was able to complete TUG in 40 s., meeting 1 of her long-term goals.  Since initiating OP PT, pt denies any falls at home, and was able to attend appointments with [facility], receiving a LLE AFO several weeks ago.  Her balance and stability ambulating are improved with use of this device; however, she continues to avoid using any device other than her hemi-walker at home d/t fear of tripping over her dogs.  At this point, she would benefit from continued therapy to improve her endurance, balance, and independence ambulating, but she reports she cannot continue here at [facility].  She is now discharged from OP PT.  Body Structures, Functions, Activity Limitations Requiring Skilled Therapeutic Intervention: Decreased functional  mobility , Decreased ROM, Decreased strength, Decreased ADL status, Decreased body mechanics, Decreased endurance, Decreased balance, Decreased fine motor control    Post-Treatment Pain Level:      Activity Tolerance: Patient limited by pain; Patient limited by fatigue    No data recorded     GOALS   Patient Goals : To regain use of her arm, and walk rather than use a wheelchair  Short Term Goals Completed by 3-4 weeks Current Status Goal Status   Demonstrate independence with HEP to maximize L hip, knee, and ankle strength to facilitate improved balance and independence ambulating   Not Met   Ambulate 150' with LRAD, SBA, to demonstrate improved endurance, and stability ambulating community distances   Not Met   Ascend/Descend 4 steps with Min A to facilitate improved independence with household mobility   Not Met                                                               Long Term Goals Completed by 6-8 weeks Current Status Goal Status   Complete TUG in 60 s. or less to facilitate improved speed and independence with household ambulation   Met   Improve L hip and knee strength to grossly 4/5 to facilitate improved balance and independence with  transfers and ambulation   Not Met   Ambulate at least 250' with AFO LLE, walker or cane, (I), to facilitate improved independence with community mobility   Not Met                                                                TREATMENT PLAN   Plan Frequency: 1-2x/week  Current Treatment Recommendations: Strengthening, ROM, Gait training, Balance training, Functional mobility training, Neuromuscular re-education, Transfer training, Stair training, Wheelchair mobility training, ADL/Self-care training, Cognitive/Perceptual training, Endurance training, Home exercise program, Safety education & training, Patient/Caregiver education & training, Equipment evaluation, education, & procurement, Therapeutic activities, Manual, Pain management, Positioning, Modalities   Specific  Instructions for Next Treatment: HEP; ambulation; endurance and LLE strengthening      Patient Status: [x]  Discharge PT. Recommend patient continue with HEP.     Signature: Electronically signed by Jorja Loa, PT [phone] on 07/29/2021 at 3:56 PM.     If you have any questions or concerns, please don't hesitate to call.  Thank you for your referral!

## 2021-07-29 NOTE — Progress Notes (Signed)
Physical Therapy: Daily Note   Patient: Rachel Sandoval (71 y.o. female)   Examination Date: 04/03/2535  Plan of Care/Certification Expiration Date: 08/11/21    Progress Note Counter: 5     DOB:  [birth year] # of Visits since [facility]: 4     MRN: [id number]  CSN: [id number] Start of Care Date: 06/16/2021     Insurance: Payor: [employer] MEDICARE / Plan: [place] HMO / Product Type: Medicare /   Insurance ID: [id number] - (Medicare Managed) Secondary Insurance (if applicable):    Referring Physician: Elnora Morrison, MD Melina Modena III, MD   PCP: Elnora Morrison, MD Visits to Date/Visits Approved: 4 /      No Show/Cancelled Appts:   /       Medical Diagnosis: Spastic hemiplegia affecting left nondominant side [G81.14] M62.81 (ICD-10-CM) - Muscle weakness (generalized)  I63.50 (ICD-10-CM) - Cerebral infarction due to unspecified occlusion or stenosis of unspecified cerebral artery  R26.81 (ICD-10-CM) - Unsteadiness on feet  R53.1 (ICD-10-CM) - Weakness  No data recorded      SUBJECTIVE EXAMINATION   Pain Level:  0/10    Patient Comments: Subjective: Pt  returns to OP PT.  She reports this will be her last session as she is returning to [facility] OP services.    HEP Compliance: Poor        OBJECTIVE EXAMINATION   Restrictions:  Restrictions/Precautions: Fall Risk   No data recorded No data recorded Position Activity Restriction  Other position/activity restrictions: L hemiplegia      Ambulation/Gait (if applicable):     Ambulation  Surface: Level tile  Device: Small [employer]  Assistance: Supervision  Quality of Gait: Slow speed, partial step-through pattern, limited d/t fear of falling though no LOB noted.  Distance: 70'  More Ambulation?: Yes    Ambulation 2  Surface - 2: level tile  Device 2: Parallel Bars  Assistance 2: Independent  Quality of Gait 2: Fast pace, improved confidence in // bars, step-through pattern, no LOB, RUE suport.  Distance: 20'    Ambulation 3  Surface - 3: level  tile  Device 3: Small [employer]  Assistance 3: Supervision  Quality of Gait 3: 20' as part of TUG, no LOB, faster pace, able to complete in 40 s.  Distance: 20'    Stairs/Curb  Stairs?: Yes  Stairs  Curbs: 6" (Practiced single step x 3 trials with RUE support on HR.)  Assistance: Minimal assistance, Contact guard assistance  Comment: Cues for technique to ascend/descend single step x 3 trials.  Limited d/t fear of falling, and lack of practice, but able to complete with MIn-CGA for LLE control, and encouragement.    TREATMENT     Exercises:  Therapeutic exercise (CPT 97110)   Treatment Reasoning    Exercise 1: // bars: lateral weight shift x 10, A/P weight shift x 10, R/L hip ABD x 10, partial heel raise x 10, all with UE support.        1:1 Time (minutes): 15                 Therapeutic Activities: (CPT 06301) Treatment Reasoning    Ther Act Exercise 1: Educated pt to continued therapy goals including progressing endurance, increasing ambulation with cane, and maximize independence with community mobility.  Pt reports she will progress at her own pace.  She is pleased that she has an AFO, and has received injections for knee pain.  This should  allow her to maximize her mobility.  Ther Act Exercise 2: Pt requested to use restroom.  She was able to complete toilet transfer without assist, and doff brief.  After urinating, she completed peri-care without assist, but did require assist to pull up her brief and pants. Limitations addressed: Mobility, Balance  Therapist provided: Assistance, Verbal cuing, Manual cuing  Functional ability(s) targeted: Ambulating community distances, Performing self care actvities   1:1 Time (minutes): 15                Pt Education: PT Education: PT Role, Goals, Family Education, Plan of Care, Evaluative findings, Equipment, Home Exercise Program, Precautions, Training and development officer, Heritage manager, Orientation, Personnel officer, Adaptive Device Training, Fall prevention strategies, Disease  Specific Education, Injury Prevention       ASSESSMENT     Assessment: Assessment: Pt requested to be discharged today.  She has not met any of her short-term goals, but was able to complete TUG in 40 s., meeting 1 of her long-term goals.  Since initiating OP PT, pt denies any falls at home, and was able to attend appointments with [place] [facility], receiving a LLE AFO several weeks ago.  Her balance and stability ambulating are improved with use of this device; however, she continues to avoid using any device other than her hemi-walker at home d/t fear of tripping over her dogs.  At this point, she would benefit from continued therapy to improve her endurance, balance, and independence ambulating, but she reports she cannot continue here at [facility].  She is now discharged from OP PT.  Body Structures, Functions, Activity Limitations Requiring Skilled Therapeutic Intervention: Decreased functional mobility , Decreased ROM, Decreased strength, Decreased ADL status, Decreased body mechanics, Decreased endurance, Decreased balance, Decreased fine motor control    Post-Treatment Pain Level:      Activity Tolerance: Patient limited by pain; Patient limited by fatigue    No data recorded     GOALS   Patient Goals : To regain use of her arm, and walk rather than use a wheelchair  Short Term Goals Completed by 3-4 weeks Current Status Goal Status   Demonstrate independence with HEP to maximize L hip, knee, and ankle strength to facilitate improved balance and independence ambulating   Not Met   Ambulate 150' with LRAD, SBA, to demonstrate improved endurance, and stability ambulating community distances   Not Met   Ascend/Descend 4 steps with Min A to facilitate improved independence with household mobility   Not Met                                                               Long Term Goals Completed by 6-8 weeks Current Status Goal Status   Complete TUG in 60 s. or less to facilitate improved speed and independence with  household ambulation   Met   Improve L hip and knee strength to grossly 4/5 to facilitate improved balance and independence with transfers and ambulation   Not Met   Ambulate at least 250' with AFO LLE, walker or cane, (I), to facilitate improved independence with community mobility   Not Met  TREATMENT PLAN   Plan Frequency: 1-2x/week  Current Treatment Recommendations: Strengthening, ROM, Gait training, Balance training, Functional mobility training, Neuromuscular re-education, Transfer training, Stair training, Wheelchair mobility training, ADL/Self-care training, Cognitive/Perceptual training, Endurance training, Home exercise program, Safety education & training, Patient/Caregiver education & training, Equipment evaluation, education, & procurement, Therapeutic activities, Manual, Pain management, Positioning, Modalities   Specific Instructions for Next Treatment: HEP; ambulation; endurance and LLE strengthening       Therapy Time  Individual Time In: 1245       Individual Time Out: 1350  Minutes: 65  Timed Code Treatment Minutes: 30 Minutes  Therapeutic Exercise x 1 charge  Therapeutic Activity x 1 charge     Electronically signed by Jorja Loa, PT [phone]  on 07/29/2021 at 3:53 PM   POC NOTE

## 2021-08-05 ENCOUNTER — Encounter: Payer: MEDICARE | Primary: Physical Medicine & Rehabilitation

## 2021-09-24 ENCOUNTER — Ambulatory Visit (INDEPENDENT_AMBULATORY_CARE_PROVIDER_SITE_OTHER): Payer: Medicare Other | Admitting: Physician Assistant

## 2021-09-24 DIAGNOSIS — L820 Inflamed seborrheic keratosis: Secondary | ICD-10-CM

## 2021-09-24 DIAGNOSIS — L304 Erythema intertrigo: Secondary | ICD-10-CM

## 2021-09-24 DIAGNOSIS — Z85828 Personal history of other malignant neoplasm of skin: Secondary | ICD-10-CM

## 2021-09-24 DIAGNOSIS — Z1283 Encounter for screening for malignant neoplasm of skin: Secondary | ICD-10-CM | POA: Diagnosis not present

## 2021-09-24 DIAGNOSIS — L570 Actinic keratosis: Secondary | ICD-10-CM | POA: Diagnosis not present

## 2021-09-24 MED ORDER — ALCLOMETASONE DIPROPIONATE 0.05 % EX CREA
TOPICAL_CREAM | Freq: Two times a day (BID) | CUTANEOUS | 6 refills | Status: AC | PRN
Start: 2021-09-24 — End: ?

## 2021-09-24 MED ORDER — KETOCONAZOLE 2 % EX CREA: 1.0000 "application " | TOPICAL_CREAM | Freq: Two times a day (BID) | CUTANEOUS | 6 refills | Status: AC

## 2021-09-24 NOTE — Patient Instructions (Signed)
Z ABSORB AF powder..OTC ?

## 2021-09-28 ENCOUNTER — Encounter: Payer: Self-pay | Admitting: Physician Assistant

## 2021-09-28 NOTE — Progress Notes (Signed)
? ?  Follow-Up Visit ?  ?Subjective  ?Jacqueline Weiss is a 71 y.o. female who presents for the following: Annual Exam (Here for annual skin exam. Concerns right ear possible wart like lesion per patient. Also has crust on left preauricular. History of non mole skin cancers. ). ? ? ?The following portions of the chart were reviewed this encounter and updated as appropriate:  Tobacco  Allergies  Meds  Problems  Med Hx  Surg Hx  Fam Hx   ?  ? ?Objective  ?Well appearing patient in no apparent distress; mood and affect are within normal limits. ? ?A full examination was performed including scalp, head, eyes, ears, nose, lips, neck, chest, axillae, abdomen, back, buttocks, bilateral upper extremities, bilateral lower extremities, hands, feet, fingers, toes, fingernails, and toenails. All findings within normal limits unless otherwise noted below. ? ?Mid Supratip of Nose, Right Ear, Right Malar Cheek ?Stuck-on, waxy papules and plaques.  ? ?Left Temple, Right Zygomatic Area ?Erythematous patches with gritty scale. ? ?Left Inframammary Fold, Right Inframammary Fold ?Erythematous fold with slight scale ? ? ?Assessment & Plan  ?Inflamed seborrheic keratosis (3) ?Right Ear; Mid Supratip of Nose; Right Malar Cheek ? ?Destruction of lesion - Mid Supratip of Nose, Right Ear, Right Malar Cheek ?Complexity: simple   ?Destruction method: cryotherapy   ?Informed consent: discussed and consent obtained   ?Timeout:  patient name, date of birth, surgical site, and procedure verified ?Lesion destroyed using liquid nitrogen: Yes   ?Cryotherapy cycles:  1 ?Outcome: patient tolerated procedure well with no complications   ?Post-procedure details: wound care instructions given   ? ?AK (actinic keratosis) (2) ?Left Temple; Right Zygomatic Area ? ?Destruction of lesion - Left Temple, Right Zygomatic Area ?Complexity: simple   ?Destruction method: cryotherapy   ?Informed consent: discussed and consent obtained   ?Timeout:  patient name,  date of birth, surgical site, and procedure verified ?Lesion destroyed using liquid nitrogen: Yes   ?Cryotherapy cycles:  1 ?Outcome: patient tolerated procedure well with no complications   ?Post-procedure details: wound care instructions given   ? ?Intertrigo ?Left Inframammary Fold; Right Inframammary Fold ? ?alclomethasone (ACLOVATE) 0.05 % cream - Left Inframammary Fold, Right Inframammary Fold ?Apply topically 2 (two) times daily as needed (Rash). ? ?ketoconazole (NIZORAL) 2 % cream - Left Inframammary Fold, Right Inframammary Fold ?Apply 1 application. topically 2 (two) times daily. ? ? ? ?I, Yobani Schertzer, PA-C, have reviewed all documentation's for this visit.  The documentation on 09/28/21 for the exam, diagnosis, procedures and orders are all accurate and complete. ?

## 2021-12-11 DIAGNOSIS — E7849 Other hyperlipidemia: Secondary | ICD-10-CM | POA: Diagnosis not present

## 2021-12-11 DIAGNOSIS — R0989 Other specified symptoms and signs involving the circulatory and respiratory systems: Secondary | ICD-10-CM | POA: Diagnosis not present

## 2021-12-11 DIAGNOSIS — E782 Mixed hyperlipidemia: Secondary | ICD-10-CM | POA: Diagnosis not present

## 2021-12-11 DIAGNOSIS — Z6824 Body mass index (BMI) 24.0-24.9, adult: Secondary | ICD-10-CM | POA: Diagnosis not present

## 2021-12-11 DIAGNOSIS — I6521 Occlusion and stenosis of right carotid artery: Secondary | ICD-10-CM | POA: Diagnosis not present

## 2021-12-11 DIAGNOSIS — Z0001 Encounter for general adult medical examination with abnormal findings: Secondary | ICD-10-CM | POA: Diagnosis not present

## 2021-12-11 DIAGNOSIS — Z1331 Encounter for screening for depression: Secondary | ICD-10-CM | POA: Diagnosis not present

## 2021-12-11 DIAGNOSIS — I10 Essential (primary) hypertension: Secondary | ICD-10-CM | POA: Diagnosis not present

## 2021-12-17 ENCOUNTER — Other Ambulatory Visit: Payer: Self-pay | Admitting: *Deleted

## 2021-12-17 DIAGNOSIS — I6521 Occlusion and stenosis of right carotid artery: Secondary | ICD-10-CM

## 2021-12-30 ENCOUNTER — Ambulatory Visit (INDEPENDENT_AMBULATORY_CARE_PROVIDER_SITE_OTHER): Payer: Medicare Other | Admitting: Vascular Surgery

## 2021-12-30 ENCOUNTER — Encounter: Payer: Self-pay | Admitting: Vascular Surgery

## 2021-12-30 ENCOUNTER — Ambulatory Visit (INDEPENDENT_AMBULATORY_CARE_PROVIDER_SITE_OTHER): Payer: Medicare Other

## 2021-12-30 VITALS — BP 156/82 | HR 57 | Temp 99.3°F | Resp 16 | Ht 61.0 in | Wt 131.4 lb

## 2021-12-30 DIAGNOSIS — I6521 Occlusion and stenosis of right carotid artery: Secondary | ICD-10-CM

## 2021-12-30 NOTE — Progress Notes (Signed)
[facility] [place]  Patient name: Jacqueline Weiss MRN: [id number] DOB: March 15, 1951 Sex: female  REASON FOR VISIT: Follow-up right carotid stenosis, asymptomatic  HPI: Jacqueline Weiss is a 71 y.o. female here today for follow-up.  She had undergone carotid duplex for right bruit.  I saw her in our office on 11/10/2020.  At that time she had a duplex suggesting a 50 to 69% carotid stenosis.  She specifically denies any neurologic deficits.  No history of amaurosis fugax, transient ischemic attack or stroke.  She remains quite active and is doing a great deal of traveling during retirement  Past Medical History:  Diagnosis Date   Atypical nevus 01/22/2014   mild atypia - lower back (no treatment)   Hypertension    SCC (squamous cell carcinoma) in situ 11/10/2006   left lower leg - treated with Aldara cream    Family History  Problem Relation Age of Onset   Colon cancer Neg Hx     SOCIAL HISTORY: Social History   Tobacco Use   Smoking status: Never    Passive exposure: Never   Smokeless tobacco: Never  Substance Use Topics   Alcohol use: Yes    Comment: couple glasses of wine daily    No Known Allergies  Current Outpatient Medications  Medication Sig Dispense Refill   alclomethasone (ACLOVATE) 0.05 % cream Apply topically 2 (two) times daily as needed (Rash). 180 g 6   aspirin EC 81 MG tablet Take 81 mg by mouth daily.     atenolol (TENORMIN) 25 MG tablet Take 25 mg by mouth 2 (two) times daily.     atorvastatin (LIPITOR) 10 MG tablet Take 1 tablet by mouth daily.     hydrochlorothiazide (MICROZIDE) 12.5 MG capsule Take 12.5 mg by mouth daily.     atorvastatin (LIPITOR) 10 MG tablet      No current facility-administered medications for this visit.    REVIEW OF SYSTEMS:  '[X]'$  denotes positive finding, '[ ]'$  denotes negative finding Cardiac  Comments:  Chest pain or chest pressure:    Shortness of breath upon exertion:     Short of breath when lying flat:    Irregular heart rhythm:        Vascular    Pain in calf, thigh, or hip brought on by ambulation:    Pain in feet at night that wakes you up from your sleep:     Blood clot in your veins:    Leg swelling:           PHYSICAL EXAM: Vitals:   12/30/21 1036 12/30/21 1038  BP: (!) 150/82 (!) 156/82  Pulse: (!) 57   Resp: 16   Temp: 99.3 F (37.4 C)   TempSrc: Temporal   SpO2: 100%   Weight: 131 lb 6.4 oz (59.6 kg)   Height: '5\' 1"'$  (1.549 m)     GENERAL: The patient is a well-nourished female, in no acute distress. The vital signs are documented above. CARDIOVASCULAR: I do not appreciate a carotid bruit today on the right or left neck. PULMONARY: There is good air exchange  MUSCULOSKELETAL: There are no major deformities or cyanosis. NEUROLOGIC: No focal weakness or paresthesias are detected. SKIN: There are no ulcers or rashes noted. PSYCHIATRIC: The patient has a normal affect.  DATA:  Carotid duplex in our office reveals 40 to 59% right internal carotid artery stenosis and no significant stenosis in the left internal carotid artery.  MEDICAL ISSUES: I  again reviewed symptoms of carotid disease with the patient.  She knows to report immediately should this occur.  Otherwise we will see her in follow-up 1 year with repeat carotid duplex    Rosetta Posner, MD FACS [facility] [phone]  Note: Portions of this report may have been transcribed using voice recognition software.  Every effort has been made to ensure accuracy; however, inadvertent computerized transcription errors may still be present.

## 2022-08-23 DIAGNOSIS — H25813 Combined forms of age-related cataract, bilateral: Secondary | ICD-10-CM | POA: Diagnosis not present

## 2022-12-16 DIAGNOSIS — E663 Overweight: Secondary | ICD-10-CM | POA: Diagnosis not present

## 2022-12-16 DIAGNOSIS — J301 Allergic rhinitis due to pollen: Secondary | ICD-10-CM | POA: Diagnosis not present

## 2022-12-16 DIAGNOSIS — R051 Acute cough: Secondary | ICD-10-CM | POA: Diagnosis not present

## 2022-12-16 DIAGNOSIS — Z6825 Body mass index (BMI) 25.0-25.9, adult: Secondary | ICD-10-CM | POA: Diagnosis not present

## 2022-12-16 DIAGNOSIS — Z1331 Encounter for screening for depression: Secondary | ICD-10-CM | POA: Diagnosis not present

## 2022-12-16 DIAGNOSIS — Z0001 Encounter for general adult medical examination with abnormal findings: Secondary | ICD-10-CM | POA: Diagnosis not present

## 2022-12-16 DIAGNOSIS — D225 Melanocytic nevi of trunk: Secondary | ICD-10-CM | POA: Diagnosis not present

## 2022-12-16 DIAGNOSIS — I10 Essential (primary) hypertension: Secondary | ICD-10-CM | POA: Diagnosis not present

## 2022-12-16 DIAGNOSIS — X32XXXA Exposure to sunlight, initial encounter: Secondary | ICD-10-CM | POA: Diagnosis not present

## 2022-12-16 DIAGNOSIS — E7849 Other hyperlipidemia: Secondary | ICD-10-CM | POA: Diagnosis not present

## 2022-12-16 DIAGNOSIS — Z1283 Encounter for screening for malignant neoplasm of skin: Secondary | ICD-10-CM | POA: Diagnosis not present

## 2022-12-16 DIAGNOSIS — I6521 Occlusion and stenosis of right carotid artery: Secondary | ICD-10-CM | POA: Diagnosis not present

## 2022-12-16 DIAGNOSIS — L570 Actinic keratosis: Secondary | ICD-10-CM | POA: Diagnosis not present

## 2022-12-16 DIAGNOSIS — E782 Mixed hyperlipidemia: Secondary | ICD-10-CM | POA: Diagnosis not present

## 2022-12-16 DIAGNOSIS — R7309 Other abnormal glucose: Secondary | ICD-10-CM | POA: Diagnosis not present

## 2023-02-03 DIAGNOSIS — E663 Overweight: Secondary | ICD-10-CM | POA: Diagnosis not present

## 2023-02-03 DIAGNOSIS — R052 Subacute cough: Secondary | ICD-10-CM | POA: Diagnosis not present

## 2023-02-03 DIAGNOSIS — Z6825 Body mass index (BMI) 25.0-25.9, adult: Secondary | ICD-10-CM | POA: Diagnosis not present

## 2023-04-10 IMAGING — US US CAROTID DUPLEX BILAT
1 series · 13 of 24 positions shown · non-contrast
Comparison: None.

CLINICAL DATA: 69-year-old female with a history of right carotid
bruit

EXAM:
BILATERAL CAROTID DUPLEX ULTRASOUND
TECHNIQUE: Gray scale imaging, color Doppler and duplex ultrasound were
performed of bilateral carotid and vertebral arteries in the neck.

[Series 1: us carotid bilateral · 13 of 69 slices shown]
[im 1/69]
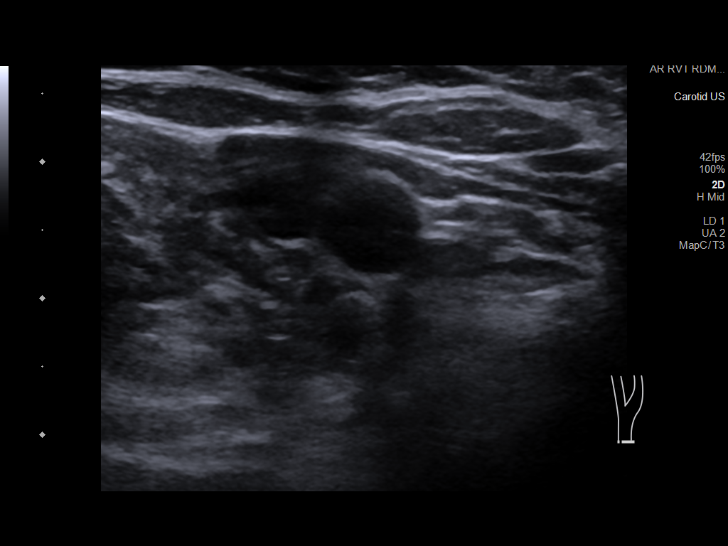
[im 6/69]
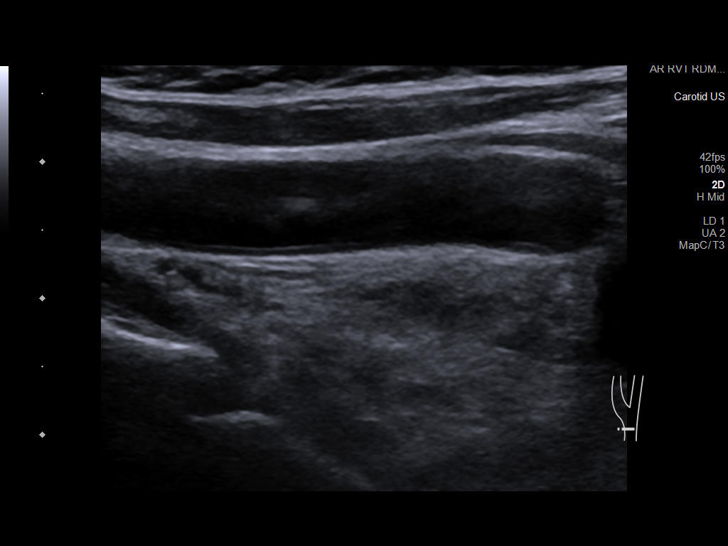
[im 12/69]
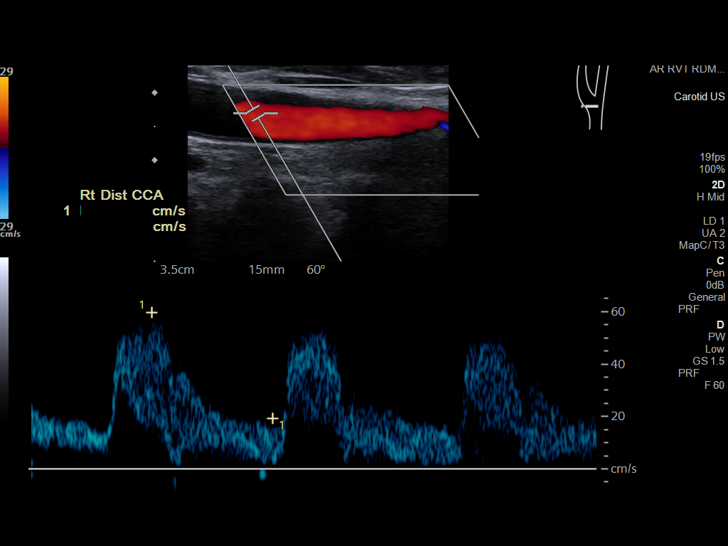
[im 18/69]
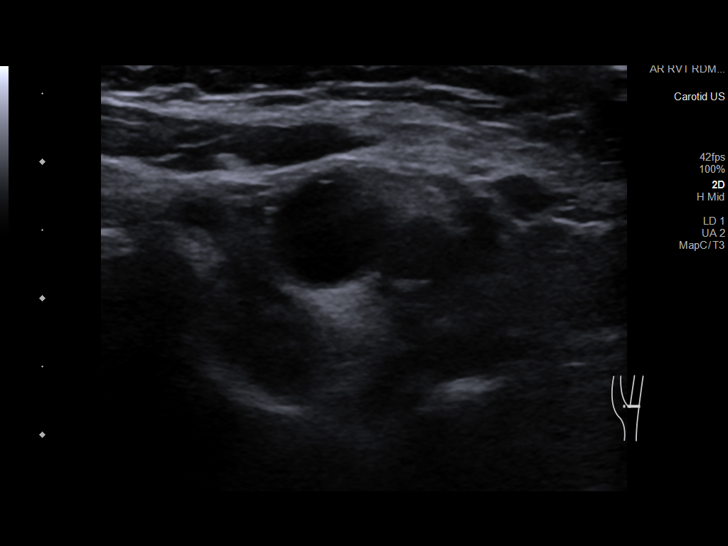
[im 24/69]
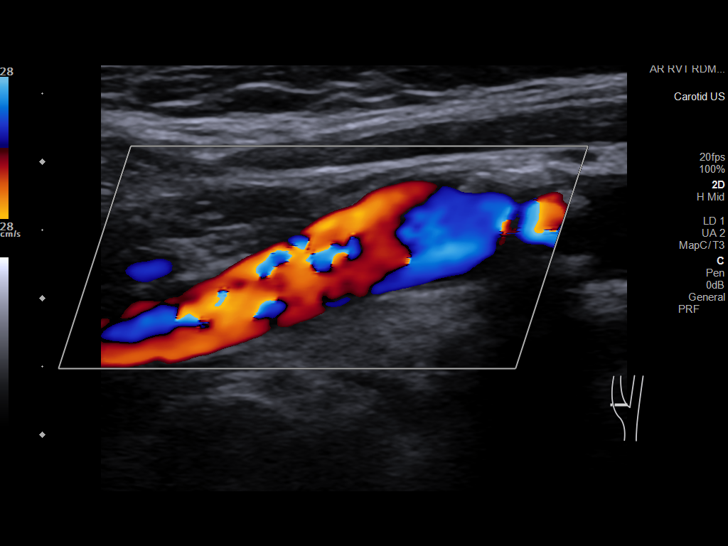
[im 30/69]
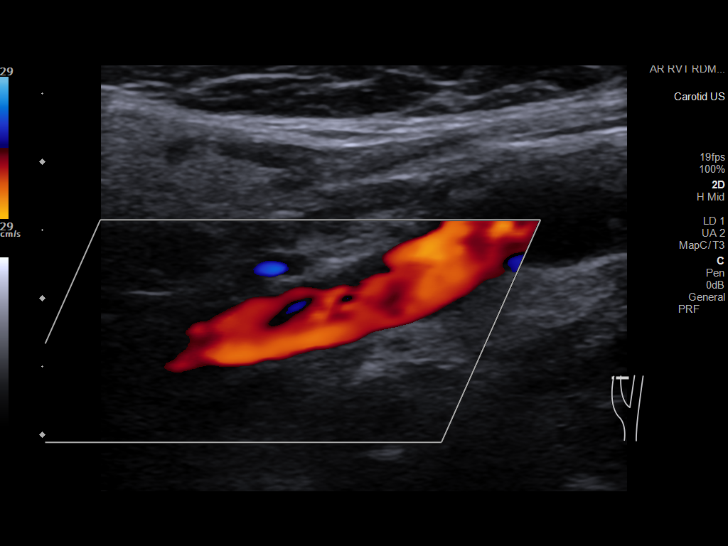
[im 36/69]
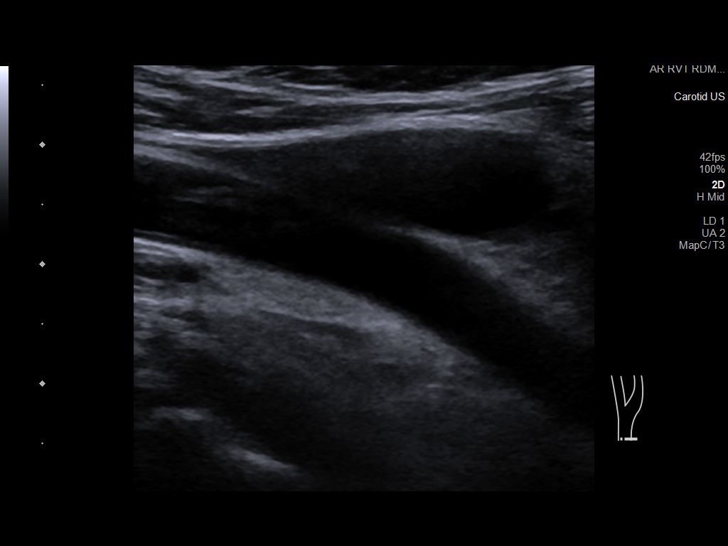
[im 39/69]
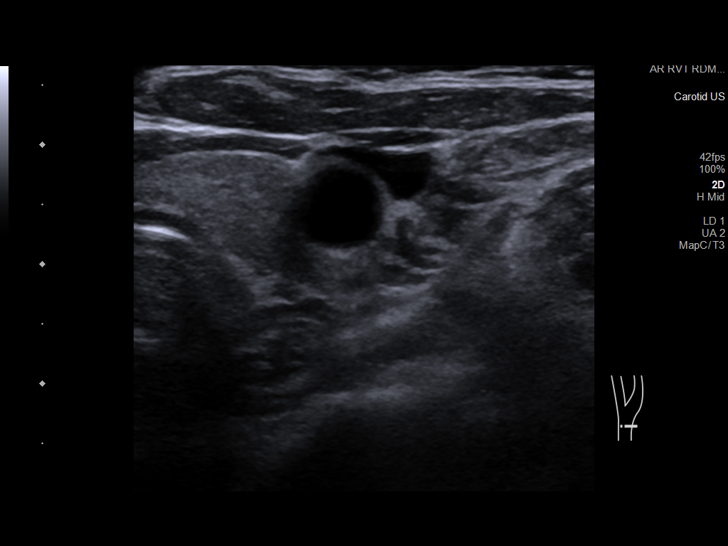
[im 45/69]
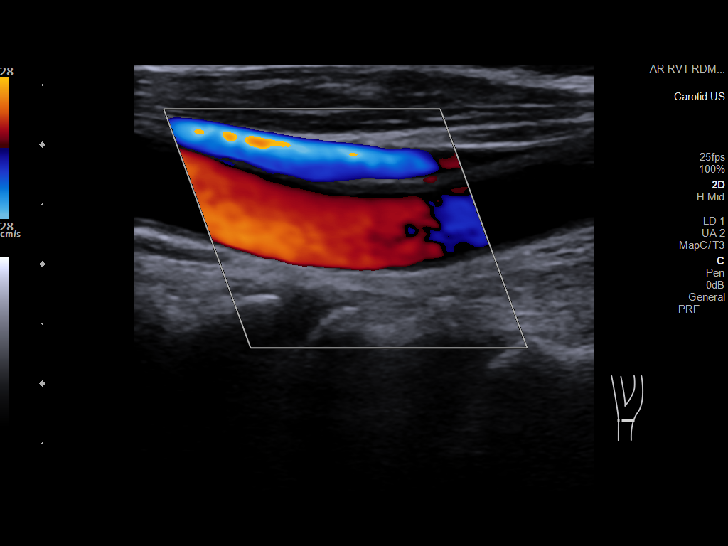
[im 51/69]
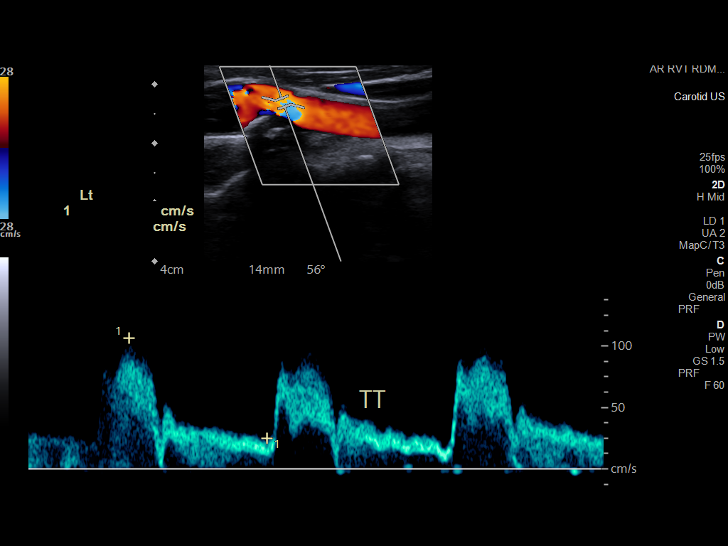
[im 57/69]
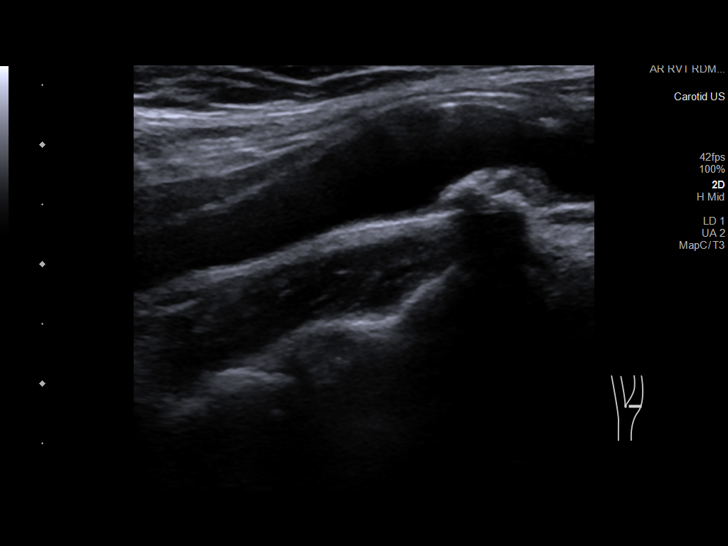
[im 63/69]
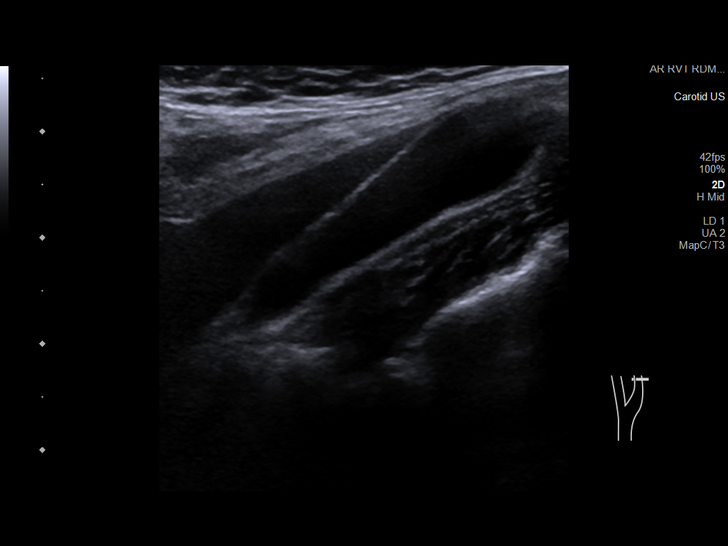
[im 69/69]
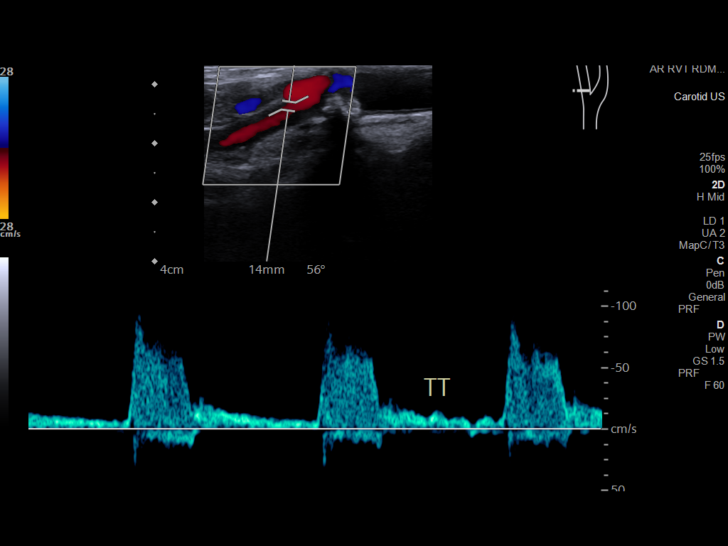

[13 of 24 positions shown; findings below may reference images not displayed]

FINDINGS: Criteria: Quantification of carotid stenosis is based on velocity
parameters that correlate the residual internal carotid diameter
with NASCET-based stenosis levels, using the diameter of the distal
internal carotid lumen as the denominator for stenosis measurement.

The following velocity measurements were obtained:

RIGHT

ICA:  Systolic 196 cm/sec, Diastolic 40 cm/sec

CCA:  70 cm/sec

SYSTOLIC ICA/CCA RATIO:

ECA:  226 cm/sec

LEFT

ICA:  Systolic 88 cm/sec, Diastolic 27 cm/sec

CCA:  80 cm/sec

SYSTOLIC ICA/CCA RATIO:

ECA:  100 cm/sec

Right Brachial SBP: Not acquired

Left Brachial SBP: Not acquired

RIGHT CAROTID ARTERY: No significant calcifications of the right
common carotid artery. Intermediate waveform maintained. Moderate
heterogeneous and partially calcified plaque at the right carotid
bifurcation. No significant lumen shadowing. Low resistance waveform
of the right ICA. No significant tortuosity.

RIGHT VERTEBRAL ARTERY: Antegrade flow with low resistance waveform.

LEFT CAROTID ARTERY: No significant calcifications of the left
common carotid artery. Intermediate waveform maintained. Moderate
heterogeneous and partially calcified plaque at the left carotid
bifurcation. No significant lumen shadowing. Low resistance waveform
of the left ICA. No significant tortuosity.

LEFT VERTEBRAL ARTERY:  Antegrade flow with low resistance waveform.
IMPRESSION: Right:

Heterogeneous and partially calcified plaque at the right carotid
bifurcation contributes to 50%-69% stenosis by established duplex
criteria.

Left:

Color duplex indicates moderate heterogeneous and calcified plaque
at the left carotid bifurcation, with no hemodynamically significant
stenosis by duplex criteria in the extracranial cerebrovascular
circulation.

## 2023-12-29 DIAGNOSIS — Z0001 Encounter for general adult medical examination with abnormal findings: Secondary | ICD-10-CM | POA: Diagnosis not present

## 2023-12-29 DIAGNOSIS — E7849 Other hyperlipidemia: Secondary | ICD-10-CM | POA: Diagnosis not present

## 2023-12-29 DIAGNOSIS — E782 Mixed hyperlipidemia: Secondary | ICD-10-CM | POA: Diagnosis not present

## 2023-12-29 DIAGNOSIS — E663 Overweight: Secondary | ICD-10-CM | POA: Diagnosis not present

## 2023-12-29 DIAGNOSIS — Z6825 Body mass index (BMI) 25.0-25.9, adult: Secondary | ICD-10-CM | POA: Diagnosis not present

## 2023-12-29 DIAGNOSIS — Z1331 Encounter for screening for depression: Secondary | ICD-10-CM | POA: Diagnosis not present

## 2023-12-29 DIAGNOSIS — I6521 Occlusion and stenosis of right carotid artery: Secondary | ICD-10-CM | POA: Diagnosis not present

## 2023-12-30 LAB — LAB REPORT - SCANNED: EGFR: 94

## 2024-01-25 DIAGNOSIS — H2513 Age-related nuclear cataract, bilateral: Secondary | ICD-10-CM | POA: Diagnosis not present

## 2024-01-25 DIAGNOSIS — H40053 Ocular hypertension, bilateral: Secondary | ICD-10-CM | POA: Diagnosis not present

## 2024-04-04 ENCOUNTER — Ambulatory Visit: Payer: Self-pay

## 2024-04-04 VITALS — BP 158/76 | HR 60 | Ht 60.0 in | Wt 134.0 lb

## 2024-04-04 DIAGNOSIS — Z1231 Encounter for screening mammogram for malignant neoplasm of breast: Secondary | ICD-10-CM

## 2024-04-04 DIAGNOSIS — Z5320 Procedure and treatment not carried out because of patient's decision for unspecified reasons: Secondary | ICD-10-CM

## 2024-04-04 DIAGNOSIS — E782 Mixed hyperlipidemia: Secondary | ICD-10-CM

## 2024-04-04 DIAGNOSIS — I6521 Occlusion and stenosis of right carotid artery: Secondary | ICD-10-CM | POA: Diagnosis not present

## 2024-04-04 DIAGNOSIS — I10 Essential (primary) hypertension: Secondary | ICD-10-CM

## 2024-04-04 DIAGNOSIS — E785 Hyperlipidemia, unspecified: Secondary | ICD-10-CM | POA: Insufficient documentation

## 2024-04-04 NOTE — Progress Notes (Signed)
 New Patient Office Visit  Subjective    Patient ID: Jacqueline Weiss, female    DOB: [birth year]  Age: 73 y.o. MRN: [id number]  CC:  Chief Complaint  Patient presents with   Establish Care    Pt here to establish care    HPI Jacqueline Weiss presents to establish care Discussed the use of AI scribe software for clinical note transcription with the patient, who gave verbal consent to proceed.  History of Present Illness   Jacqueline Weiss is a 73 year old female who presents for a routine follow-up.  Hypertension - Diagnosed in her forties - Managed with atenolol and hydrochlorothiazide - Home blood pressure readings variable, initially high and then decrease upon retesting - Family history of hypertension in both parents  Carotid bruit - History of carotid bruit evaluated by vascular specialist two years ago - No follow-up since specialist's retirement and the COVID-19 pandemic  Peripheral edema - Occasional swelling in left leg, particularly in hot weather - Manages swelling with compression socks - No associated dizziness - No changes in bowel movements  Dental issues and bruxism - Teeth clenching and grinding managed with bite block - Occasionally takes diazepam prescribed by dentist - History of two root canals on back teeth  Abnormal blood glucose - Mild abnormalities in blood sugar levels noted at last physical examination in July - No family history of diabetes - Family history of high cholesterol  Breast cysts and mammography anxiety - History of breast cysts, previously drained - Last mammogram in 2019 - Apprehensive about undergoing mammogram due to fear of finding concerning results       Outpatient Encounter Medications as of 04/04/2024  Medication Sig   alclomethasone (ACLOVATE ) 0.05 % cream Apply topically 2 (two) times daily as needed (Rash).   aspirin EC 81 MG tablet Take 81 mg by mouth daily.   atenolol (TENORMIN) 25 MG tablet Take 25 mg by mouth 2  (two) times daily.   atorvastatin (LIPITOR) 10 MG tablet    diazepam (VALIUM) 10 MG tablet Take 10 mg by mouth at bedtime.   hydrochlorothiazide (MICROZIDE) 12.5 MG capsule Take 12.5 mg by mouth daily.   [DISCONTINUED] atorvastatin (LIPITOR) 10 MG tablet Take 1 tablet by mouth daily.   No facility-administered encounter medications on file as of 04/04/2024.    Past Medical History:  Diagnosis Date   Atypical nevus 01/22/2014   mild atypia - lower back (no treatment)   Hypertension    SCC (squamous cell carcinoma) in situ 11/10/2006   left lower leg - treated with Aldara cream    Past Surgical History:  Procedure Laterality Date   ABDOMINAL HYSTERECTOMY     BREAST BIOPSY Right    benign   COLONOSCOPY N/A 05/20/2014   Procedure: COLONOSCOPY;  Surgeon: Lamar CHRISTELLA Hollingshead, MD;  Location: [facility];  Service: Endoscopy;  Laterality: N/A;  930    Family History  Problem Relation Age of Onset   Colon cancer Neg Hx     Social History   Socioeconomic History   Marital status: Married    Spouse name: Not on file   Number of children: Not on file   Years of education: Not on file   Highest education level: Not on file  Occupational History   Not on file  Tobacco Use   Smoking status: Never    Passive exposure: Never   Smokeless tobacco: Never  Substance and Sexual Activity   Alcohol use: Yes  Comment: couple glasses of wine daily   Drug use: No   Sexual activity: Yes  Other Topics Concern   Not on file  Social History Narrative   Not on file   Social Drivers of Health   Financial Resource Strain: Not on file  Food Insecurity: Not on file  Transportation Needs: Not on file  Physical Activity: Not on file  Stress: Not on file  Social Connections: Not on file  Intimate Partner Violence: Not on file    ROS      Objective    BP (!) 158/76   Pulse 60   Ht 5' (1.524 m)   Wt 134 lb 0.6 oz (60.8 kg)   SpO2 96%   BMI 26.18 kg/m   Physical Exam Vitals  and nursing note reviewed.  Constitutional:      Appearance: Normal appearance.  HENT:     Head: Normocephalic.     Right Ear: Tympanic membrane, ear canal and external ear normal.     Left Ear: Tympanic membrane, ear canal and external ear normal.     Nose: Nose normal.     Mouth/Throat:     Mouth: Mucous membranes are moist.     Pharynx: Oropharynx is clear.  Cardiovascular:     Rate and Rhythm: Normal rate and regular rhythm.  Pulmonary:     Effort: Pulmonary effort is normal.     Breath sounds: Normal breath sounds.  Musculoskeletal:     Cervical back: Normal range of motion and neck supple.  Skin:    General: Skin is warm and dry.  Neurological:     Mental Status: She is alert and oriented to person, place, and time.  Psychiatric:        Mood and Affect: Mood normal.        Thought Content: Thought content normal.         Assessment & Plan:   Problem List Items Addressed This Visit       Cardiovascular and Mediastinum   Hypertension (Chronic)   Long-standing hypertension, likely hereditary. Managed with atenolol and hydrochlorothiazide. Blood pressure elevated during visit. - Monitor blood pressure at home regularly. - Continue current medications: atenolol and hydrochlorothiazide.      Carotid stenosis, asymptomatic, right   Discussed need for updated evaluation due to potential progression to 80% stenosis. - Refer to vascular specialist for updated evaluation and ultrasound.      Relevant Orders   Ambulatory referral to Vascular Surgery     Other   Hyperlipidemia - Primary (Chronic)   Managed with atorvastatin 10 mg.  Will update lipid panel at future visit.       Mammogram declined   Significant anxiety regarding mammograms due to past experiences. Discussed option of mammogram at facility with same-day results to alleviate anxiety. - Consider mammogram at facility with same-day results. - Order mammogram if she decides to proceed.      Other  Visit Diagnoses       Encounter for screening mammogram for malignant neoplasm of breast       Relevant Orders   MM Digital Screening       Return in about 6 months (around 10/03/2024) for chronic follow-up with PCP.   Leita Longs, FNP

## 2024-04-05 ENCOUNTER — Other Ambulatory Visit: Payer: Self-pay | Admitting: Vascular Surgery

## 2024-04-05 DIAGNOSIS — I6521 Occlusion and stenosis of right carotid artery: Secondary | ICD-10-CM

## 2024-04-11 DIAGNOSIS — Z5320 Procedure and treatment not carried out because of patient's decision for unspecified reasons: Secondary | ICD-10-CM | POA: Insufficient documentation

## 2024-04-11 NOTE — Assessment & Plan Note (Signed)
 Managed with atorvastatin 10 mg.  Will update lipid panel at future visit.

## 2024-04-11 NOTE — Assessment & Plan Note (Signed)
 Long-standing hypertension, likely hereditary. Managed with atenolol and hydrochlorothiazide. Blood pressure elevated during visit. - Monitor blood pressure at home regularly. - Continue current medications: atenolol and hydrochlorothiazide.

## 2024-04-11 NOTE — Assessment & Plan Note (Signed)
 Significant anxiety regarding mammograms due to past experiences. Discussed option of mammogram at facility with same-day results to alleviate anxiety. - Consider mammogram at facility with same-day results. - Order mammogram if she decides to proceed.

## 2024-04-11 NOTE — Assessment & Plan Note (Signed)
 Discussed need for updated evaluation due to potential progression to 80% stenosis. - Refer to vascular specialist for updated evaluation and ultrasound.

## 2024-05-01 DIAGNOSIS — H2513 Age-related nuclear cataract, bilateral: Secondary | ICD-10-CM | POA: Diagnosis not present

## 2024-05-01 DIAGNOSIS — H40053 Ocular hypertension, bilateral: Secondary | ICD-10-CM | POA: Diagnosis not present

## 2024-05-08 ENCOUNTER — Encounter (INDEPENDENT_AMBULATORY_CARE_PROVIDER_SITE_OTHER): Payer: Self-pay | Admitting: *Deleted

## 2024-05-17 DIAGNOSIS — L570 Actinic keratosis: Secondary | ICD-10-CM | POA: Diagnosis not present

## 2024-05-17 DIAGNOSIS — Z1283 Encounter for screening for malignant neoplasm of skin: Secondary | ICD-10-CM | POA: Diagnosis not present

## 2024-05-17 DIAGNOSIS — X32XXXD Exposure to sunlight, subsequent encounter: Secondary | ICD-10-CM | POA: Diagnosis not present

## 2024-05-17 DIAGNOSIS — Z85828 Personal history of other malignant neoplasm of skin: Secondary | ICD-10-CM | POA: Diagnosis not present

## 2024-05-17 DIAGNOSIS — L821 Other seborrheic keratosis: Secondary | ICD-10-CM | POA: Diagnosis not present

## 2024-05-17 DIAGNOSIS — D225 Melanocytic nevi of trunk: Secondary | ICD-10-CM | POA: Diagnosis not present

## 2024-05-17 DIAGNOSIS — Z08 Encounter for follow-up examination after completed treatment for malignant neoplasm: Secondary | ICD-10-CM | POA: Diagnosis not present

## 2024-05-24 ENCOUNTER — Ambulatory Visit

## 2024-05-24 ENCOUNTER — Encounter: Payer: Self-pay | Admitting: Physician Assistant

## 2024-05-24 ENCOUNTER — Ambulatory Visit (HOSPITAL_COMMUNITY): Admission: RE | Admit: 2024-05-24 | Discharge: 2024-05-24 | Attending: Vascular Surgery

## 2024-05-24 VITALS — BP 149/83 | HR 59 | Temp 98.0°F | Wt 135.4 lb

## 2024-05-24 DIAGNOSIS — I6521 Occlusion and stenosis of right carotid artery: Secondary | ICD-10-CM

## 2024-05-24 NOTE — Progress Notes (Signed)
 Office Note     CC:  follow up Requesting Provider:  Bevely Doffing, FNP  HPI: Jacqueline Weiss is a 73 y.o. (05-24-1951) female who presents for surveillance follow up of carotid artery stenosis. We have been following her asymptomatic right ICA stenosis of 50-69%. She has no history of TIA or stroke.   Today she denies any amaurosis or other visual changes, slurred speech, facial drooping, unilateral upper or lower extremity weakness or numbness. She stays very active- walks regularly. She and her husband enjoy camping and traveling. 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 Medically managed on Aspirin and statin  Past Medical History:  Diagnosis Date   Atypical nevus 01/22/2014   mild atypia - lower back (no treatment)   Hypertension    SCC (squamous cell carcinoma) in situ 11/10/2006   left lower leg - treated with Aldara cream    Past Surgical History:  Procedure Laterality Date   ABDOMINAL HYSTERECTOMY     BREAST BIOPSY Right    benign   COLONOSCOPY N/A 05/20/2014   Procedure: COLONOSCOPY;  Surgeon: Lamar CHRISTELLA Hollingshead, MD;  Location: [facility];  Service: Endoscopy;  Laterality: N/A;  930    Social History   Socioeconomic History   Marital status: Married    Spouse name: Not on file   Number of children: Not on file   Years of education: Not on file   Highest education level: Not on file  Occupational History   Not on file  Tobacco Use   Smoking status: Never    Passive exposure: Never   Smokeless tobacco: Never  Substance and Sexual Activity   Alcohol use: Yes    Comment: couple glasses of wine daily   Drug use: No   Sexual activity: Yes  Other Topics Concern   Not on file  Social History Narrative   Not on file   Social Drivers of Health   Tobacco Use: Low Risk (05/24/2024)   Patient History    Smoking Tobacco Use: Never    Smokeless Tobacco Use: Never    Passive Exposure: Never  Financial Resource Strain: Not on file  Food  Insecurity: Not on file  Transportation Needs: Not on file  Physical Activity: Not on file  Stress: Not on file  Social Connections: Not on file  Intimate Partner Violence: Not on file  Depression (PHQ2-9): Low Risk (04/04/2024)   Depression (PHQ2-9)    PHQ-2 Score: 0  Alcohol Screen: Not on file  Housing: Not on file  Utilities: Not on file  Health Literacy: Not on file    Family History  Problem Relation Age of Onset   Colon cancer Neg Hx     Current Outpatient Medications  Medication Sig Dispense Refill   alclomethasone (ACLOVATE ) 0.05 % cream Apply topically 2 (two) times daily as needed (Rash). 180 g 6   aspirin EC 81 MG tablet Take 81 mg by mouth daily.     atenolol (TENORMIN) 25 MG tablet Take 25 mg by mouth 2 (two) times daily.     atorvastatin (LIPITOR) 10 MG tablet      diazepam (VALIUM) 10 MG tablet Take 10 mg by mouth at bedtime.     hydrochlorothiazide (MICROZIDE) 12.5 MG capsule Take 12.5 mg by mouth daily.     No current facility-administered medications for this visit.    Allergies[1]   REVIEW OF SYSTEMS:  Negative unless noted in HPI [X]  denotes positive finding, [ ]   denotes negative finding Cardiac  Comments:  Chest pain or chest pressure:    Shortness of breath upon exertion:    Short of breath when lying flat:    Irregular heart rhythm:        Vascular    Pain in calf, thigh, or hip brought on by ambulation:    Pain in feet at night that wakes you up from your sleep:     Blood clot in your veins:    Leg swelling:         Pulmonary    Oxygen at home:    Productive cough:     Wheezing:         Neurologic    Sudden weakness in arms or legs:     Sudden numbness in arms or legs:     Sudden onset of difficulty speaking or slurred speech:    Temporary loss of vision in one eye:     Problems with dizziness:         Gastrointestinal    Blood in stool:     Vomited blood:         Genitourinary    Burning when urinating:     Blood in urine:         Psychiatric    Major depression:         Hematologic    Bleeding problems:    Problems with blood clotting too easily:        Skin    Rashes or ulcers:        Constitutional    Fever or chills:      PHYSICAL EXAMINATION:  Vitals:   05/24/24 1257 05/24/24 1259  BP: (!) 152/81 (!) 149/83  Pulse: (!) 59 (!) 59  Temp: 98 F (36.7 C)   TempSrc: Temporal     General: very pleasant;  WDWN in NAD; vital signs documented above Gait: normal HENT: WNL, normocephalic Pulmonary: normal non-labored breathing Cardiac: regular HR Abdomen: soft Vascular Exam/Pulses: 2+ radial, 2+ Dp pulses, extremities warm and well perfused Extremities: without ischemic changes, without Gangrene , without cellulitis; without open wounds;  Musculoskeletal: no muscle wasting or atrophy  Neurologic: A&O X 3 Psychiatric:  The pt has Normal affect.   Non-Invasive Vascular Imaging:   VAS US  Carotid Duplex: Summary:  Right Carotid: Velocities in the right ICA are consistent with a 40-59% stenosis.   Left Carotid: Velocities in the left ICA are consistent with a 1-39% stenosis.   Vertebrals: Bilateral vertebral arteries demonstrate antegrade flow.  Subclavians: Normal flow hemodynamics were seen in bilateral subclavian arteries.    ASSESSMENT/PLAN:: 73 y.o. female presents for surveillance follow up of carotid artery stenosis. We have been following her asymptomatic right ICA stenosis of 50-69%. She has no history of TIA or stroke. She remains without any associated symptoms. Duplex shows right ICA stenosis of 40-59% which is stable. Left ICA with 1-39%. Normal flow in the vertebral and subclavian arteries - She will continue her aspirin and statin - she will follow up in 1 year with repeat carotid duplex  - She would like to be seen in our [place] office as it is closer to her home   Teretha Damme, [facility] [phone]  Clinic MD:  [place]     [1] No Known  Allergies

## 2024-05-25 ENCOUNTER — Telehealth: Payer: Self-pay | Admitting: *Deleted

## 2024-05-25 MED ORDER — PEG 3350-KCL-NA BICARB-NACL 420 G PO SOLR: 4000.0000 mL | ORAL | 0 refills | Status: AC

## 2024-05-25 NOTE — Telephone Encounter (Signed)
ASA 2 appropriate.  ?

## 2024-05-25 NOTE — Addendum Note (Signed)
 Addended by: JEANELL GRAEME RAMAN on: 05/25/2024 10:47 AM   Modules accepted: Orders

## 2024-05-25 NOTE — Telephone Encounter (Signed)
 Spoke with pt. She has been scheduled with Dr. Shaaron 06/22/24. Aware will mail instructions to her and send rx for prep to pharmacy.

## 2024-05-25 NOTE — Telephone Encounter (Signed)
" °  Procedure: COLONOSCOPY  Height: 132LBS Weight: 5'1       Have you had a colonoscopy before?  05/20/14, Dr. Shaaron  Do you have family history of colon cancer?  no  Do you have a family history of polyps? no  Previous colonoscopy with polyps removed? no  Do you have a history colorectal cancer?   no  Are you diabetic?  no  Do you have a prosthetic or mechanical heart valve? no  Do you have a pacemaker/defibrillator?   no  Have you had endocarditis/atrial fibrillation?  no  Do you use supplemental oxygen/CPAP?  no  Have you had joint replacement within the last 12 months?  no  Do you tend to be constipated or have to use laxatives?  no   Do you have history of alcohol use? If yes, how much and how often.  no  Do you have history or are you using drugs? If yes, what do are you  using?  no  Have you ever had a stroke/heart attack?  no  Have you ever had a heart or other vascular stent placed,?no  Do you take weight loss medication? no  female patients,: have you had a hysterectomy? yes                              are you post menopausal?  yes                              do you still have your menstrual cycle? no    Date of last menstrual period?   Do you take any blood-thinning medications such as: (Plavix, aspirin, Coumadin, Aggrenox, Brilinta, Xarelto, Eliquis, Pradaxa, Savaysa or Effient)? Aspirin 81mg   If yes we need the name, milligram, dosage and who is prescribing doctor:  no             Current Outpatient Medications  Medication Sig Dispense Refill   aspirin EC 81 MG tablet Take 81 mg by mouth daily.     atenolol (TENORMIN) 25 MG tablet Take 25 mg by mouth 2 (two) times daily.     atorvastatin (LIPITOR) 10 MG tablet      hydrochlorothiazide (MICROZIDE) 12.5 MG capsule Take 12.5 mg by mouth daily.     No current facility-administered medications for this visit.    Allergies[1]      [1] No Known Allergies  "

## 2024-05-25 NOTE — Telephone Encounter (Signed)
 Questionnaire from recall, no referral needed

## 2024-06-11 ENCOUNTER — Ambulatory Visit
Admission: RE | Admit: 2024-06-11 | Discharge: 2024-06-11 | Disposition: A | Discharge status: Home / Self Care | Source: Ambulatory Visit

## 2024-06-11 DIAGNOSIS — Z1231 Encounter for screening mammogram for malignant neoplasm of breast: Secondary | ICD-10-CM

## 2024-06-14 ENCOUNTER — Other Ambulatory Visit: Payer: Self-pay

## 2024-06-14 DIAGNOSIS — R928 Other abnormal and inconclusive findings on diagnostic imaging of breast: Secondary | ICD-10-CM

## 2024-06-21 NOTE — Telephone Encounter (Signed)
 Pt wanted to know if she is supposed to add the infant simethicone  drops to the prep prior to drinking. Advised pt that she will add the simethicone  drops prior to drinking the prep. Verbalized understanding.

## 2024-06-22 ENCOUNTER — Encounter (HOSPITAL_COMMUNITY): Admission: RE | Disposition: A | Payer: Self-pay | Source: Home / Self Care | Attending: Internal Medicine

## 2024-06-22 ENCOUNTER — Encounter (HOSPITAL_COMMUNITY): Payer: Self-pay | Admitting: Internal Medicine

## 2024-06-22 ENCOUNTER — Ambulatory Visit (HOSPITAL_COMMUNITY): Admitting: Anesthesiology

## 2024-06-22 ENCOUNTER — Other Ambulatory Visit: Payer: Self-pay

## 2024-06-22 ENCOUNTER — Ambulatory Visit (HOSPITAL_COMMUNITY)
Admission: RE | Admit: 2024-06-22 | Discharge: 2024-06-22 | Disposition: A | Discharge status: Home / Self Care | Attending: Internal Medicine | Admitting: Internal Medicine

## 2024-06-22 DIAGNOSIS — Z1211 Encounter for screening for malignant neoplasm of colon: Secondary | ICD-10-CM | POA: Insufficient documentation

## 2024-06-22 DIAGNOSIS — I10 Essential (primary) hypertension: Secondary | ICD-10-CM | POA: Diagnosis not present

## 2024-06-22 DIAGNOSIS — K5730 Diverticulosis of large intestine without perforation or abscess without bleeding: Secondary | ICD-10-CM | POA: Insufficient documentation

## 2024-06-22 HISTORY — PX: COLONOSCOPY: SHX5424

## 2024-06-22 MED ORDER — LIDOCAINE 2% (20 MG/ML) 5 ML SYRINGE
INTRAMUSCULAR | Status: DC | PRN
  Administered 2024-06-22: 50 mg via INTRAVENOUS

## 2024-06-22 MED ORDER — PROPOFOL 500 MG/50ML IV EMUL
INTRAVENOUS | Status: DC | PRN
  Administered 2024-06-22: 150 ug/kg/min via INTRAVENOUS

## 2024-06-22 MED ORDER — LACTATED RINGERS IV SOLN: INTRAVENOUS | Status: DC

## 2024-06-22 MED ORDER — PROPOFOL 10 MG/ML IV BOLUS
INTRAVENOUS | Status: DC | PRN
  Administered 2024-06-22 (×2): 50 mg via INTRAVENOUS

## 2024-06-22 MED ORDER — EPHEDRINE SULFATE (PRESSORS) 25 MG/5ML IV SOSY
PREFILLED_SYRINGE | INTRAVENOUS | Status: DC | PRN
  Administered 2024-06-22 (×2): 5 mg via INTRAVENOUS

## 2024-06-22 NOTE — Op Note (Signed)
 [facility] Patient Name: Jacqueline Weiss Procedure Date: 06/22/2024 8:40 AM MRN: [id number] Date of Birth: [birth year] Attending MD: Jacqueline Weiss , MD, [id number] CSN: [id number] Age: 74 Admit Type: Outpatient Procedure:                Colonoscopy Indications:              Screening for colorectal malignant neoplasm Providers:                Jacqueline Ozell Hollingshead, MD, [place], [place] Blush Referring MD:              Medicines:                Propofol  per Anesthesia Complications:            No immediate complications. Estimated Blood Loss:     Estimated blood loss: none. Procedure:                Pre-Anesthesia Assessment:                           - Prior to the procedure, a History and Physical                            was performed, and patient medications and                            allergies were reviewed. The patient's tolerance of                            previous anesthesia was also reviewed. The risks                            and benefits of the procedure and the sedation                            options and risks were discussed with the patient.                            All questions were answered, and informed consent                            was obtained. Prior Anticoagulants: The patient has                            taken no anticoagulant or antiplatelet agents. ASA                            Grade Assessment: III - A patient with severe                            systemic disease. After reviewing the risks and                            benefits, the patient was deemed in satisfactory  condition to undergo the procedure.                           After obtaining informed consent, the colonoscope                            was passed under direct vision. Throughout the                            procedure, the patient's blood pressure, pulse, and                            oxygen saturations were monitored  continuously. The                            [id number] ([id number]) Colon was introduced through                            the anus and advanced to the the cecum, identified                            by appendiceal orifice and ileocecal valve. The                            colonoscopy was performed without difficulty. The                            patient tolerated the procedure well. The quality                            of the bowel preparation was adequate. The                            ileocecal valve, appendiceal orifice, and rectum                            were photographed. Scope In: 9:04:27 AM Scope Out: 9:15:20 AM Scope Withdrawal Time: 0 hours 5 minutes 37 seconds  Total Procedure Duration: 0 hours 10 minutes 53 seconds  Findings:      The perianal and digital rectal examinations were normal.      Scattered medium-mouthed diverticula were found in the sigmoid colon and       descending colon.      The exam was otherwise without abnormality on direct and retroflexion       views. Impression:               - Diverticulosis in the sigmoid colon and in the                            descending colon.                           - The examination was otherwise normal on direct  and retroflexion views.                           - No specimens collected. Moderate Sedation:      Moderate (conscious) sedation was personally administered by an       anesthesia professional. The following parameters were monitored: oxygen       saturation, heart rate, blood pressure, respiratory rate, EKG, adequacy       of pulmonary ventilation, and response to care. Recommendation:           - Patient has a contact number available for                            emergencies. The signs and symptoms of potential                            delayed complications were discussed with the                            patient. Return to normal activities tomorrow.                             Written discharge instructions were provided to the                            patient.                           - Advance diet as tolerated.                           - Continue present medications.                           - No repeat colonoscopy due to age.                           - Return to GI clinic (date not yet determined). Procedure Code(s):        --- Professional ---                           [phone], Colonoscopy, flexible; diagnostic, including                            collection of specimen(s) by brushing or washing,                            when performed (separate procedure) Diagnosis Code(s):        --- Professional ---                           Z12.11, Encounter for screening for malignant                            neoplasm of colon  K57.30, Diverticulosis of large intestine without                            perforation or abscess without bleeding CPT copyright 2022 American Medical Association. All rights reserved. The codes documented in this report are preliminary and upon coder review may  be revised to meet current compliance requirements. Jacqueline HERO. Rilie Glanz, MD Jacqueline Ozell Hollingshead, MD 06/22/2024 9:24:32 AM This report has been signed electronically. Number of Addenda: 0

## 2024-06-22 NOTE — Transfer of Care (Signed)
 Immediate Anesthesia Transfer of Care Note  Patient: Jacqueline Weiss  Procedure(s) Performed: COLONOSCOPY  Patient Location: Short Stay  Anesthesia Type:MAC  Level of Consciousness: awake, alert , oriented, and patient cooperative  Airway & Oxygen Therapy: Patient Spontanous Breathing  Post-op Assessment: Report given to RN and Post -op Vital signs reviewed and stable  Post vital signs: Reviewed and stable  Last Vitals:  Vitals Value Taken Time  BP 103/44 06/22/24 09:20  Temp 36.4 C 06/22/24 09:20  Pulse 79 06/22/24 09:20  Resp 15 06/22/24 09:20  SpO2 100% on RA 06/22/24 09:20    Last Pain:  Vitals:   06/22/24 0920  TempSrc: Oral  PainSc:       Patients Stated Pain Goal: 8 (06/22/24 0736)  Complications: No notable events documented.

## 2024-06-22 NOTE — Anesthesia Preprocedure Evaluation (Signed)
 Anesthesia Evaluation  Patient identified by MRN, date of birth, ID band Patient awake    Reviewed: Allergy & Precautions, H&P , NPO status , Patient's Chart, lab work & pertinent test results, reviewed documented beta blocker date and time   Airway Mallampati: II  TM Distance: >3 FB Neck ROM: full    Dental no notable dental hx.    Pulmonary neg pulmonary ROS   Pulmonary exam normal breath sounds clear to auscultation       Cardiovascular Exercise Tolerance: Good hypertension, negative cardio ROS  Rhythm:regular Rate:Normal     Neuro/Psych negative neurological ROS  negative psych ROS   GI/Hepatic negative GI ROS, Neg liver ROS,,,  Endo/Other  negative endocrine ROS    Renal/GU negative Renal ROS  negative genitourinary   Musculoskeletal   Abdominal   Peds  Hematology negative hematology ROS (+)   Anesthesia Other Findings   Reproductive/Obstetrics negative OB ROS                              Anesthesia Physical Anesthesia Plan  ASA: 2  Anesthesia Plan: MAC   Post-op Pain Management:    Induction:   PONV Risk Score and Plan: Propofol  infusion  Airway Management Planned:   Additional Equipment:   Intra-op Plan:   Post-operative Plan:   Informed Consent: I have reviewed the patients History and Physical, chart, labs and discussed the procedure including the risks, benefits and alternatives for the proposed anesthesia with the patient or authorized representative who has indicated his/her understanding and acceptance.     Dental Advisory Given  Plan Discussed with: CRNA  Anesthesia Plan Comments:         Anesthesia Quick Evaluation

## 2024-06-22 NOTE — H&P (Signed)
 @LOGO @   Primary Care Physician:  Bevely Doffing, FNP Primary Gastroenterologist:  Dr. Shaaron  Pre-Procedure History & Physical: HPI:  Jacqueline Weiss is a 74 y.o. female is here for a screening colonoscopy.   Diverticulosis only found 10 years ago.  Past Medical History:  Diagnosis Date   Atypical nevus 01/22/2014   mild atypia - lower back (no treatment)   Hypertension    SCC (squamous cell carcinoma) in situ 11/10/2006   left lower leg - treated with Aldara cream    Past Surgical History:  Procedure Laterality Date   ABDOMINAL HYSTERECTOMY     BREAST BIOPSY Right    benign   COLONOSCOPY N/A 05/20/2014   Procedure: COLONOSCOPY;  Surgeon: Lamar CHRISTELLA Shaaron, MD;  Location: [facility];  Service: Endoscopy;  Laterality: N/A;  930    Prior to Admission medications  Medication Sig Start Date End Date Taking? Authorizing Provider  atenolol (TENORMIN) 25 MG tablet Take 25 mg by mouth 2 (two) times daily.   Yes [provider]  atorvastatin (LIPITOR) 10 MG tablet  05/09/18  Yes [provider]  hydrochlorothiazide (MICROZIDE) 12.5 MG capsule Take 12.5 mg by mouth daily.   Yes [provider]  polyethylene glycol-electrolytes (NULYTELY) 420 g solution Take 4,000 mLs by mouth as directed. 05/25/24  Yes RourkLamar CHRISTELLA, MD  aspirin EC 81 MG tablet Take 81 mg by mouth daily.    [provider]    Allergies as of 05/25/2024   (No Known Allergies)    Family History  Problem Relation Age of Onset   Colon cancer Neg Hx     Social History   Socioeconomic History   Marital status: Married    Spouse name: Not on file   Number of children: Not on file   Years of education: Not on file   Highest education level: Not on file  Occupational History   Not on file  Tobacco Use   Smoking status: Never    Passive exposure: Never   Smokeless tobacco: Never  Substance and Sexual Activity   Alcohol use: Yes    Comment: couple glasses of wine daily    Drug use: No   Sexual activity: Yes  Other Topics Concern   Not on file  Social History Narrative   Not on file   Social Drivers of Health   Tobacco Use: Low Risk (05/24/2024)   Patient History    Smoking Tobacco Use: Never    Smokeless Tobacco Use: Never    Passive Exposure: Never  Financial Resource Strain: Not on file  Food Insecurity: Not on file  Transportation Needs: Not on file  Physical Activity: Not on file  Stress: Not on file  Social Connections: Not on file  Intimate Partner Violence: Not on file  Depression (PHQ2-9): Low Risk (04/04/2024)   Depression (PHQ2-9)    PHQ-2 Score: 0  Alcohol Screen: Not on file  Housing: Not on file  Utilities: Not on file  Health Literacy: Not on file    Review of Systems: See HPI, otherwise negative ROS  Physical Exam: BP (!) 161/72   Pulse 80   Temp 97.6 F (36.4 C) (Oral)   Resp 14   Ht 5' 1 (1.549 m)   Wt 61.2 kg   SpO2 100%   BMI 25.51 kg/m  General:   Alert,  Well-developed, well-nourished, pleasant and cooperative in NAD Neck:  Supple; no masses or thyromegaly. Lungs:  Clear throughout to auscultation.  No wheezes, crackles, or rhonchi. No acute distress. Heart:  Regular rate and rhythm; no murmurs, clicks, rubs,  or gallops. Abdomen:  Soft, nontender and nondistended. No masses, hepatosplenomegaly or hernias noted. Normal bowel sounds, without guarding, and without rebound.    Impression/Plan: Jacqueline Weiss is now here to undergo a screening colonoscopy.    Average risk screening examination.  Risks, benefits, limitations, imponderables and alternatives regarding colonoscopy have been reviewed with the patient. Questions have been answered. All parties agreeable.     Notice:  This dictation was prepared with Dragon dictation along with smaller phrase technology. Any transcriptional errors that result from this process are unintentional and may not be corrected upon review.

## 2024-06-22 NOTE — Anesthesia Postprocedure Evaluation (Signed)
"   Anesthesia Post Note  Patient: Jacqueline Weiss  Procedure(s) Performed: COLONOSCOPY  Patient location during evaluation: Phase II Anesthesia Type: MAC Level of consciousness: awake Pain management: pain level controlled Vital Signs Assessment: post-procedure vital signs reviewed and stable Respiratory status: spontaneous breathing and respiratory function stable Cardiovascular status: blood pressure returned to baseline and stable Postop Assessment: no headache and no apparent nausea or vomiting Anesthetic complications: no Comments: Late entry   No notable events documented.   Last Vitals:  Vitals:   06/22/24 0920 06/22/24 0926  BP: (!) 103/44 (!) 116/57  Pulse: 79   Resp: 17   Temp: 36.4 C   SpO2: 100%     Last Pain:  Vitals:   06/22/24 0926  TempSrc:   PainSc: 0-No pain                 Yvonna PARAS Charnise Lovan      "

## 2024-06-22 NOTE — Discharge Instructions (Addendum)
" °  Colonoscopy Discharge Instructions  Read the instructions outlined below and refer to this sheet in the next few weeks. These discharge instructions provide you with general information on caring for yourself after you leave the hospital. Your doctor may also give you specific instructions. While your treatment has been planned according to the most current medical practices available, unavoidable complications occasionally occur. If you have any problems or questions after discharge, call Dr. Shaaron at [phone]. ACTIVITY You may resume your regular activity, but move at a slower pace for the next 24 hours.  Take frequent rest periods for the next 24 hours.  Walking will help get rid of the air and reduce the bloated feeling in your belly (abdomen).  No driving for 24 hours (because of the medicine (anesthesia) used during the test).   Do not sign any important legal documents or operate any machinery for 24 hours (because of the anesthesia used during the test).  NUTRITION Drink plenty of fluids.  You may resume your normal diet as instructed by your doctor.  Begin with a light meal and progress to your normal diet. Heavy or fried foods are harder to digest and may make you feel sick to your stomach (nauseated).  Avoid alcoholic beverages for 24 hours or as instructed.  MEDICATIONS You may resume your normal medications unless your doctor tells you otherwise.  WHAT YOU CAN EXPECT TODAY Some feelings of bloating in the abdomen.  Passage of more gas than usual.  Spotting of blood in your stool or on the toilet paper.  IF YOU HAD POLYPS REMOVED DURING THE COLONOSCOPY: No aspirin products for 7 days or as instructed.  No alcohol for 7 days or as instructed.  Eat a soft diet for the next 24 hours.  FINDING OUT THE RESULTS OF YOUR TEST Not all test results are available during your visit. If your test results are not back during the visit, make an appointment with your caregiver to find out the  results. Do not assume everything is normal if you have not heard from your caregiver or the medical facility. It is important for you to follow up on all of your test results.  SEEK IMMEDIATE MEDICAL ATTENTION IF: You have more than a spotting of blood in your stool.  Your belly is swollen (abdominal distention).  You are nauseated or vomiting.  You have a temperature over 101.  You have abdominal pain or discomfort that is severe or gets worse throughout the day.     no polyps found today  Diverticulosis present-unchanged  A future colonoscopy is not recommended unless you develop new symptoms. "

## 2024-06-26 ENCOUNTER — Encounter (HOSPITAL_COMMUNITY): Payer: Self-pay | Admitting: Internal Medicine

## 2024-06-27 ENCOUNTER — Ambulatory Visit: Admission: RE | Admit: 2024-06-27 | Discharge: 2024-06-27 | Disposition: A | Source: Ambulatory Visit

## 2024-06-27 DIAGNOSIS — R928 Other abnormal and inconclusive findings on diagnostic imaging of breast: Secondary | ICD-10-CM

## 2024-10-03 ENCOUNTER — Ambulatory Visit
